# Patient Record
Sex: Female | Born: 1995 | Race: Black or African American | Hispanic: No | Marital: Single | Smoking: Never smoker
Health system: Southern US, Community
[De-identification: ages and names within clinical notes are randomized; demographics above are authoritative.]

## PROBLEM LIST (undated history)

## (undated) DIAGNOSIS — D649 Anemia, unspecified: Secondary | ICD-10-CM

---

## 2019-01-23 ENCOUNTER — Emergency Department (HOSPITAL_COMMUNITY)
Admission: EM | Admit: 2019-01-23 | Discharge: 2019-01-23 | Disposition: A | Discharge status: Home / Self Care | Payer: Self-pay | Attending: Emergency Medicine | Admitting: Emergency Medicine

## 2019-01-23 ENCOUNTER — Other Ambulatory Visit: Payer: Self-pay

## 2019-01-23 ENCOUNTER — Encounter (HOSPITAL_COMMUNITY): Payer: Self-pay | Admitting: Emergency Medicine

## 2019-01-23 DIAGNOSIS — H6690 Otitis media, unspecified, unspecified ear: Secondary | ICD-10-CM

## 2019-01-23 DIAGNOSIS — H6692 Otitis media, unspecified, left ear: Secondary | ICD-10-CM | POA: Insufficient documentation

## 2019-01-23 MED ORDER — IBUPROFEN 600 MG PO TABS: 600.0000 mg | ORAL_TABLET | Freq: Four times a day (QID) | ORAL | 0 refills | Status: DC | PRN

## 2019-01-23 MED ORDER — AMOXICILLIN 500 MG PO CAPS: 500.0000 mg | ORAL_CAPSULE | Freq: Three times a day (TID) | ORAL | 0 refills | Status: DC

## 2019-01-23 NOTE — ED Provider Notes (Signed)
[facility] EMERGENCY DEPARTMENT Provider Note   CSN: [id number] Arrival date & time: 01/23/19  0536    History   Chief Complaint Chief Complaint  Patient presents with  . Otalgia    HPI Angel Arnold is a 23 y.o. female.     The history is provided by the patient. No language interpreter was used.  Otalgia  Associated symptoms: no fever and no headaches      23 year old female presenting for evaluation of ear pain.  Patient reports she has had progressive worsening pain to her left ear since last night.  Pain is described as a throbbing sharp sensation, nonradiating, persistent and moderate in severity.  She feels that her ear is "clogged up".  Applying pressure to the ear with her hands does help with the pain.  She tries taking Tylenol without adequate relief.  She denies any associated fever, headache, ringing in her ear, dental pain, loss of hearing, drainage, pain when laying on the affected side, neck pain.  She does complains of some sinus congestion which is steadily improving but no sneezing or coughing.  History reviewed. No pertinent past medical history.  There are no active problems to display for this patient.   History reviewed. No pertinent surgical history.   OB History   No obstetric history on file.      Home Medications    Prior to Admission medications   Not on File    Family History No family history on file.  Social History Social History   Tobacco Use  . Smoking status: Never Smoker  . Smokeless tobacco: Never Used  Substance Use Topics  . Alcohol use: Yes  . Drug use: Never     Allergies   Patient has no known allergies.   Review of Systems Review of Systems  Constitutional: Negative for fever.  HENT: Positive for ear pain. Negative for dental problem.   Neurological: Negative for headaches.     Physical Exam Updated Vital Signs BP (!) 115/97 (BP Location: Left Arm)   Pulse 68   Temp 98.1 F (36.7  C) (Oral)   Resp 18   Ht 5\' 3"  (1.6 m)   Wt 65.8 kg   LMP 01/01/2019 (Exact Date)   SpO2 100%   BMI 25.69 kg/m   Physical Exam Vitals signs and nursing note reviewed.  Constitutional:      General: She is not in acute distress.    Appearance: She is well-developed.  HENT:     Head: Atraumatic.     Comments: Ears: Left TM is erythematous but not bulging.  No tenderness to palpation of the tragus or the earlobe.  TM is nonperforated and the normal ear canal.  Right TM is normal in appearance Nose: Normal nares Throat: Uvula midline no tonsillar enlargement or exudates, no trismus, normal dentition. Eyes:     Conjunctiva/sclera: Conjunctivae normal.  Neck:     Musculoskeletal: Neck supple.  Lymphadenopathy:     Cervical: No cervical adenopathy.  Skin:    Findings: No rash.  Neurological:     Mental Status: She is alert.      ED Treatments / Results  Labs (all labs ordered are listed, but only abnormal results are displayed) Labs Reviewed - No data to display  EKG None  Radiology No results found.  Procedures Procedures (including critical care time)  Medications Ordered in ED Medications - No data to display   Initial Impression / Assessment and Plan / ED Course  I have reviewed the triage vital signs and the nursing notes.  Pertinent labs & imaging results that were available during my care of the patient were reviewed by me and considered in my medical decision making (see chart for details).        BP (!) 115/97 (BP Location: Left Arm)   Pulse 68   Temp 98.1 F (36.7 C) (Oral)   Resp 18   Ht 5\' 3"  (1.6 m)   Wt 65.8 kg   LMP 01/01/2019 (Exact Date)   SpO2 100%   BMI 25.69 kg/m    Final Clinical Impressions(s) / ED Diagnoses   Final diagnoses:  Acute otitis media, unspecified otitis media type    ED Discharge Orders         Ordered    amoxicillin (AMOXIL) 500 MG capsule  3 times daily     01/23/19 0726    ibuprofen (ADVIL,MOTRIN) 600  MG tablet  Every 6 hours PRN     01/23/19 0726         6:34 AM Patient here with left ear pain.  Left TM is erythematous suggestive of otitis media.  No evidence of otitis externa.  Will treat with amoxicillin and ibuprofen.  Return precautions discussed.   Fayrene Helper, PA-C 01/23/19 6122    Nicanor Alcon, April, MD 01/23/19 [phone]

## 2019-01-23 NOTE — ED Triage Notes (Signed)
Pt reports an ear ache in her left ear. Pt reports this started about midnight. Pt denies any injury to same.

## 2019-02-02 ENCOUNTER — Telehealth: Payer: Self-pay | Admitting: Family

## 2019-02-02 DIAGNOSIS — Z7189 Other specified counseling: Secondary | ICD-10-CM

## 2019-02-02 NOTE — Progress Notes (Signed)
E-Visit for Corona Virus Screening  Based on your current symptoms, it seems unlikely that your symptoms are related to the Coronavirus.   Approximately 5 minutes was spent documenting and reviewing patient's chart.   Coronavirus disease 2019 (COVID-19) is a respiratory illness that can spread from person to person. The virus that causes COVID-19 is a new virus that was first identified in the country of [place] but is now found in multiple other countries and has spread to the [place].  Symptoms associated with the virus are mild to severe fever, cough, and shortness of breath. There is currently no vaccine to protect against COVID-19, and there is no specific antiviral treatment for the virus.   To be considered HIGH RISK for Coronavirus (COVID-19), you have to meet the following criteria:  . Traveled to [place], [place], [place], [place] or [place]; or in the [place] to [place], [place], [place], or [place]; and have fever, cough, and shortness of breath within the last 2 weeks of travel OR  . Been in close contact with a person diagnosed with COVID-19 within the last 2 weeks and have fever, cough, and shortness of breath  . IF YOU DO NOT MEET THESE CRITERIA, YOU ARE CONSIDERED LOW RISK FOR COVID-19.   It is vitally important that if you feel that you have an infection such as this virus or any other virus that you stay home and away from places where you may spread it to others.  You should self-quarantine for 14 days if you have symptoms that could potentially be coronavirus and avoid contact with people age 65 and older.     You may also take acetaminophen (Tylenol) as needed for fever.   Reduce your risk of any infection by using the same precautions used for avoiding the common cold or flu:  . Wash your hands often with soap and warm water for at least 20 seconds.  If soap and water are not readily available, use an alcohol-based hand sanitizer with at least 60% alcohol.   . If coughing or sneezing, cover your mouth and nose by coughing or sneezing into the elbow areas of your shirt or coat, into a tissue or into your sleeve (not your hands). . Avoid shaking hands with others and consider head nods or verbal greetings only. . Avoid touching your eyes, nose, or mouth with unwashed hands.  . Avoid close contact with people who are sick. . Avoid places or events with large numbers of people in one location, like concerts or sporting events. . Carefully consider travel plans you have or are making. . If you are planning any travel outside or inside the [place], visit the CDC's Travelers' Health webpage for the latest health notices. . If you have some symptoms but not all symptoms, continue to monitor at home and seek medical attention if your symptoms worsen. . If you are having a medical emergency, call 911.  HOME CARE . Only take medications as instructed by your medical team. . Drink plenty of fluids and get plenty of rest. . A steam or ultrasonic humidifier can help if you have congestion.   GET HELP RIGHT AWAY IF: . You develop worsening fever. . You become short of breath . You cough up blood. . Your symptoms become more severe MAKE SURE YOU   Understand these instructions.  Will watch your condition.  Will get help right away if you are not doing well or get worse.  Your e-visit   e-visit answers were reviewed by a board certified advanced clinical practitioner to complete your personal care plan.  Depending on the condition, your plan could have included both over the counter or prescription medications.  If there is a problem please reply once you have received a response from your provider. Your safety is important to [place].  If you have drug allergies check your prescription carefully.    You can use MyChart to ask questions about today's visit, request a non-urgent call back, or ask for a work or school excuse for 24 hours related to this e-Visit. If it has  been greater than 24 hours you will need to follow up with your provider, or enter a new e-Visit to address those concerns. You will get an e-mail in the next two days asking about your experience.  I hope that your e-visit has been valuable and will speed your recovery. Thank you for using e-visits.

## 2019-08-16 ENCOUNTER — Encounter (HOSPITAL_COMMUNITY): Payer: Self-pay

## 2019-08-16 ENCOUNTER — Inpatient Hospital Stay (HOSPITAL_COMMUNITY)
Admission: EM | Admit: 2019-08-16 | Discharge: 2019-08-16 | Disposition: A | Discharge status: Home / Self Care | Payer: Medicaid Other | Attending: Obstetrics and Gynecology | Admitting: Obstetrics and Gynecology

## 2019-08-16 ENCOUNTER — Other Ambulatory Visit: Payer: Self-pay

## 2019-08-16 ENCOUNTER — Inpatient Hospital Stay (HOSPITAL_COMMUNITY): Payer: Medicaid Other

## 2019-08-16 DIAGNOSIS — O26891 Other specified pregnancy related conditions, first trimester: Secondary | ICD-10-CM

## 2019-08-16 DIAGNOSIS — Z3491 Encounter for supervision of normal pregnancy, unspecified, first trimester: Secondary | ICD-10-CM

## 2019-08-16 DIAGNOSIS — Z3A01 Less than 8 weeks gestation of pregnancy: Secondary | ICD-10-CM | POA: Diagnosis not present

## 2019-08-16 DIAGNOSIS — R109 Unspecified abdominal pain: Secondary | ICD-10-CM

## 2019-08-16 DIAGNOSIS — R1030 Lower abdominal pain, unspecified: Secondary | ICD-10-CM | POA: Diagnosis present

## 2019-08-16 DIAGNOSIS — O23591 Infection of other part of genital tract in pregnancy, first trimester: Secondary | ICD-10-CM | POA: Insufficient documentation

## 2019-08-16 DIAGNOSIS — O210 Mild hyperemesis gravidarum: Secondary | ICD-10-CM | POA: Insufficient documentation

## 2019-08-16 DIAGNOSIS — Z3A09 9 weeks gestation of pregnancy: Secondary | ICD-10-CM

## 2019-08-16 DIAGNOSIS — B9689 Other specified bacterial agents as the cause of diseases classified elsewhere: Secondary | ICD-10-CM

## 2019-08-16 DIAGNOSIS — O219 Vomiting of pregnancy, unspecified: Secondary | ICD-10-CM

## 2019-08-16 DIAGNOSIS — N760 Acute vaginitis: Secondary | ICD-10-CM

## 2019-08-16 HISTORY — DX: Anemia, unspecified: D64.9

## 2019-08-16 LAB — CBC
HCT: 30.5 % — ABNORMAL LOW (ref 36.0–46.0)
Hemoglobin: 10.1 g/dL — ABNORMAL LOW (ref 12.0–15.0)
MCH: 29.4 pg (ref 26.0–34.0)
MCHC: 33.1 g/dL (ref 30.0–36.0)
MCV: 88.9 fL (ref 80.0–100.0)
Platelets: 286 10*3/uL (ref 150–400)
RBC: 3.43 MIL/uL — ABNORMAL LOW (ref 3.87–5.11)
RDW: 16 % — ABNORMAL HIGH (ref 11.5–15.5)
WBC: 8.9 10*3/uL (ref 4.0–10.5)
nRBC: 0 % (ref 0.0–0.2)

## 2019-08-16 LAB — URINALYSIS, ROUTINE W REFLEX MICROSCOPIC
Bacteria, UA: NONE SEEN
Bilirubin Urine: NEGATIVE
Glucose, UA: NEGATIVE mg/dL
Hgb urine dipstick: NEGATIVE
Ketones, ur: NEGATIVE mg/dL
Leukocytes,Ua: NEGATIVE
Nitrite: NEGATIVE
Protein, ur: 30 mg/dL — AB
Specific Gravity, Urine: 1.027 (ref 1.005–1.030)
pH: 7 (ref 5.0–8.0)

## 2019-08-16 LAB — ABO/RH: ABO/RH(D): A POS

## 2019-08-16 LAB — WET PREP, GENITAL
Sperm: NONE SEEN
Trich, Wet Prep: NONE SEEN
Yeast Wet Prep HPF POC: NONE SEEN

## 2019-08-16 LAB — HCG, QUANTITATIVE, PREGNANCY: hCG, Beta Chain, Quant, S: 94331 m[IU]/mL — ABNORMAL HIGH (ref <5)

## 2019-08-16 LAB — HIV ANTIBODY (ROUTINE TESTING W REFLEX): HIV Screen 4th Generation wRfx: NONREACTIVE

## 2019-08-16 LAB — POCT PREGNANCY, URINE: Preg Test, Ur: POSITIVE — AB

## 2019-08-16 MED ORDER — SCOPOLAMINE 1 MG/3DAYS TD PT72
1.0000 | MEDICATED_PATCH | TRANSDERMAL | Status: DC
  Administered 2019-08-16: 1.5 mg via TRANSDERMAL
  Filled 2019-08-16: qty 1

## 2019-08-16 MED ORDER — METRONIDAZOLE 0.75 % VA GEL: 1.0000 | Freq: Every day | VAGINAL | 1 refills | Status: DC

## 2019-08-16 MED ORDER — SCOPOLAMINE 1 MG/3DAYS TD PT72: 1.0000 | MEDICATED_PATCH | TRANSDERMAL | 12 refills | Status: DC

## 2019-08-16 NOTE — ED Triage Notes (Addendum)
Pt reports she is [redacted] weeks pregnant and experiencing vaginal bleeding and cramping.  Danelle RN approved pt to be transported to MAU

## 2019-08-16 NOTE — MAU Provider Note (Signed)
Chief Complaint: Vaginal Bleeding, Abdominal Pain, Morning Sickness, and Insomnia   First Provider Initiated Contact with Patient 08/16/19 0429      SUBJECTIVE HPI: Angel Arnold is a 23 y.o. G2P1001 at Unknown by unsure LMP who presents to maternity admissions reporting positive pregnancy test 4 weeks ago with onset of lower abdominal cramping 4 weeks ago as well. The pain is cramping and sharp in the middle of her low abdomen. It is intermittent, occurring at least one time per day, and sometimes as often as every hour.  She reports bleeding after intercourse also, sometimes enough to soak the bed with blood. She denies any bleeding today.  She has nausea with vomiting 3-4 times per day and cannot keep down food or fluids.  There are no other symptoms. She has not tried any treatments.   HPI  Past Medical History:  Diagnosis Date  . Anemia    Past Surgical History:  Procedure Laterality Date  . CESAREAN SECTION     Social History   Socioeconomic History  . Marital status: Single    Spouse name: Not on file  . Number of children: Not on file  . Years of education: Not on file  . Highest education level: Not on file  Occupational History  . Not on file  Social Needs  . Financial resource strain: Not on file  . Food insecurity    Worry: Not on file    Inability: Not on file  . Transportation needs    Medical: Not on file    Non-medical: Not on file  Tobacco Use  . Smoking status: Never Smoker  . Smokeless tobacco: Never Used  Substance and Sexual Activity  . Alcohol use: Yes  . Drug use: Never  . Sexual activity: Not Currently  Lifestyle  . Physical activity    Days per week: Not on file    Minutes per session: Not on file  . Stress: Not on file  Relationships  . Social Musicianconnections    Talks on phone: Not on file    Gets together: Not on file    Attends religious service: Not on file    Active member of club or organization: Not on file    Attends meetings of clubs  or organizations: Not on file    Relationship status: Not on file  . Intimate partner violence    Fear of current or ex partner: Not on file    Emotionally abused: Not on file    Physically abused: Not on file    Forced sexual activity: Not on file  Other Topics Concern  . Not on file  Social History Narrative  . Not on file   No current facility-administered medications on file prior to encounter.    Current Outpatient Medications on File Prior to Encounter  Medication Sig Dispense Refill  . amoxicillin (AMOXIL) 500 MG capsule Take 1 capsule (500 mg total) by mouth 3 (three) times daily. 21 capsule 0  . ibuprofen (ADVIL,MOTRIN) 600 MG tablet Take 1 tablet (600 mg total) by mouth every 6 (six) hours as needed. 30 tablet 0   No Known Allergies  ROS:  Review of Systems  Constitutional: Negative for chills, fatigue and fever.  Respiratory: Negative for shortness of breath.   Cardiovascular: Negative for chest pain.  Gastrointestinal: Positive for abdominal pain, nausea and vomiting.  Genitourinary: Positive for pelvic pain and vaginal bleeding. Negative for difficulty urinating, dysuria, flank pain, vaginal discharge and vaginal pain.  Neurological: Negative  for dizziness and headaches.  Psychiatric/Behavioral: Negative.      I have reviewed patient's Past Medical Hx, Surgical Hx, Family Hx, Social Hx, medications and allergies.   Physical Exam   Patient Vitals for the past 24 hrs:  BP Temp Temp src Pulse Resp SpO2 Height Weight  08/16/19 0248 115/68 - Oral (!) 102 16 100 %  (1.575 m) 62.6 kg  08/16/19 0156 119/68 98.9 F (37.2 C) Oral 98 16 100 % - -   Constitutional: Well-developed, well-nourished female in no acute distress.  Cardiovascular: normal rate Respiratory: normal effort GI: Abd soft, non-tender. Pos BS x 4 MS: Extremities nontender, no edema, normal ROM Neurologic: Alert and oriented x 4.  GU: Neg CVAT.  PELVIC EXAM: Cervix pink, visually closed,  without lesion, moderate amount white creamy discharge, no bleeding visualized, vaginal walls and external genitalia normal Bimanual exam: Cervix 0/long/high, firm, anterior, neg CMT, uterus nontender, nonenlarged, adnexa with tenderness on right to palpation   LAB RESULTS Results for orders placed or performed during the hospital encounter of 08/16/19 (from the past 24 hour(s))  Urinalysis, Routine w reflex microscopic     Status: Abnormal   Collection Time: 08/16/19  2:58 AM  Result Value Ref Range   Color, Urine YELLOW YELLOW   APPearance CLEAR CLEAR   Specific Gravity, Urine 1.027 1.005 - 1.030   pH 7.0 5.0 - 8.0   Glucose, UA NEGATIVE NEGATIVE mg/dL   Hgb urine dipstick NEGATIVE NEGATIVE   Bilirubin Urine NEGATIVE NEGATIVE   Ketones, ur NEGATIVE NEGATIVE mg/dL   Protein, ur 30 (A) NEGATIVE mg/dL   Nitrite NEGATIVE NEGATIVE   Leukocytes,Ua NEGATIVE NEGATIVE   RBC / HPF 0-5 0 - 5 RBC/hpf   WBC, UA 0-5 0 - 5 WBC/hpf   Bacteria, UA NONE SEEN NONE SEEN   Squamous Epithelial / LPF 0-5 0 - 5   Mucus PRESENT   Pregnancy, urine POC     Status: Abnormal   Collection Time: 08/16/19  2:59 AM  Result Value Ref Range   Preg Test, Ur POSITIVE (A) NEGATIVE  Wet prep, genital     Status: Abnormal   Collection Time: 08/16/19  4:38 AM  Result Value Ref Range   Yeast Wet Prep HPF POC NONE SEEN NONE SEEN   Trich, Wet Prep NONE SEEN NONE SEEN   Clue Cells Wet Prep HPF POC PRESENT (A) NONE SEEN   WBC, Wet Prep HPF POC MANY (A) NONE SEEN   Sperm NONE SEEN   CBC     Status: Abnormal   Collection Time: 08/16/19  4:51 AM  Result Value Ref Range   WBC 8.9 4.0 - 10.5 K/uL   RBC 3.43 (L) 3.87 - 5.11 MIL/uL   Hemoglobin 10.1 (L) 12.0 - 15.0 g/dL   HCT 16.1 (L) 09.6 - 04.5 %   MCV 88.9 80.0 - 100.0 fL   MCH 29.4 26.0 - 34.0 pg   MCHC 33.1 30.0 - 36.0 g/dL   RDW 40.9 (H) 81.1 - 91.4 %   Platelets 286 150 - 400 K/uL   nRBC 0.0 0.0 - 0.2 %  hCG, quantitative, pregnancy     Status: Abnormal    Collection Time: 08/16/19  4:51 AM  Result Value Ref Range   hCG, Beta Chain, Quant, S 94,331 (H) <5 mIU/mL  ABO/Rh     Status: None   Collection Time: 08/16/19  4:51 AM  Result Value Ref Range   ABO/RH(D) A POS    No  rh immune globuloin      NOT A RH IMMUNE GLOBULIN CANDIDATE, PT RH POSITIVE Performed at [facility], [place] [address] [place], [place] [address]     --/--/A POS (10/04 0451)  IMAGING US Ob Comp Less 14 Wks  Result Date: 08/16/2019 CLINICAL DATA:  Pelvic pain EXAM: OBSTETRIC <14 WK ULTRASOUND TECHNIQUE: Transabdominal ultrasound was performed for evaluation of the gestation as well as the maternal uterus and adnexal regions. COMPARISON:  None. FINDINGS: Intrauterine gestational sac: Single Yolk sac:  Visualized. Embryo:  Visualized. Cardiac Activity: Visualized. Heart Rate: 173 bpm CRL:   17.1 mm   8 w 1 d                  [place] EDC: 03/26/2020 Subchorionic hemorrhage: Small subchorionic hemorrhage measuring 1.5 x 0.6 x 1.1 cm. Maternal uterus/adnexae: No adnexal mass.  No pelvic free fluid. IMPRESSION: 1. Single live intrauterine pregnancy as detailed above. 2. Small subchorionic hemorrhage. Electronically Signed   By: Kathreen Devoid   On: 08/16/2019 05:46    MAU Management/MDM: Orders Placed This Encounter  Procedures  . Wet prep, genital  . US OB Comp Less 14 Wks  . Urinalysis, Routine w reflex microscopic  . CBC  . hCG, quantitative, pregnancy  . HIV Antibody (routine testing w rflx)  . Pregnancy, urine POC  . ABO/Rh  . Discharge patient    Meds ordered this encounter  Medications  . scopolamine (TRANSDERM-SCOP) 1 MG/3DAYS 1.5 mg  . scopolamine (TRANSDERM-SCOP) 1 MG/3DAYS    Sig: Place 1 patch (1.5 mg total) onto the skin every 3 (three) days.    Dispense:  10 patch    Refill:  12    Order Specific Question:   Supervising Provider    Answer:   CONSTANT, PEGGY [[id number]]  . metroNIDAZOLE (METROGEL) 0.75 % vaginal gel    Sig: Place 1 Applicatorful vaginally at  bedtime. Apply one applicatorful to vagina at bedtime for 5 days    Dispense:  70 g    Refill:  1    Order Specific Question:   Supervising Provider    Answer:   CONSTANT, PEGGY [[id number]]    [place] confirms IUP with small subchorionic hemorrhage.  Will treat BV due to pain and clinical exam.  Scopolamine patch applied in MAU and Rx written. Pt to f/u at [facility] as scheduled, return to MAU as needed for emergencies.  Pt discharged with strict return precautions.  ASSESSMENT 1. Abdominal pain during pregnancy, first trimester   2. Abdominal pain during pregnancy in first trimester   3. Normal IUP (intrauterine pregnancy) on prenatal ultrasound, first trimester   4. Bacterial vaginosis   5. Nausea and vomiting during pregnancy prior to [redacted] weeks gestation     PLAN Discharge home Allergies as of 08/16/2019   No Known Allergies     Medication List    STOP taking these medications   amoxicillin 500 MG capsule Commonly known as: AMOXIL   ibuprofen 600 MG tablet Commonly known as: ADVIL     TAKE these medications   metroNIDAZOLE 0.75 % vaginal gel Commonly known as: METROGEL Place 1 Applicatorful vaginally at bedtime. Apply one applicatorful to vagina at bedtime for 5 days   scopolamine 1 MG/3DAYS Commonly known as: TRANSDERM-SCOP Place 1 patch (1.5 mg total) onto the skin every 3 (three) days.      Follow-up Information    [facility] Follow up.   Why: As scheduled. Return to MAU as  needed for emergencies. Contact information: [address] [place] [address] [phone]          Sharen Counter Certified Nurse-Midwife 08/16/2019  6:24 AM

## 2019-08-16 NOTE — MAU Note (Signed)
Pt reports she is 7.[redacted] weeks pregnant and here with c/o of sharp abdominal pain. Has a lot of vaginal bleeding after sex. Has not had a decent night of sleep "in months." Throws up everything she eats. Confirmed at [facility]. Has OBGYN appointment on 10/26 with [facility].

## 2019-08-18 LAB — GC/CHLAMYDIA PROBE AMP (~~LOC~~) NOT AT ARMC
Chlamydia: NEGATIVE
Neisseria Gonorrhea: NEGATIVE

## 2019-09-07 ENCOUNTER — Ambulatory Visit (INDEPENDENT_AMBULATORY_CARE_PROVIDER_SITE_OTHER): Payer: Medicaid Other | Admitting: Advanced Practice Midwife

## 2019-09-07 ENCOUNTER — Encounter: Payer: Self-pay | Admitting: Advanced Practice Midwife

## 2019-09-07 ENCOUNTER — Other Ambulatory Visit: Payer: Self-pay

## 2019-09-07 VITALS — BP 107/71 | HR 103 | Wt 129.0 lb

## 2019-09-07 DIAGNOSIS — Z8659 Personal history of other mental and behavioral disorders: Secondary | ICD-10-CM

## 2019-09-07 DIAGNOSIS — Z3A11 11 weeks gestation of pregnancy: Secondary | ICD-10-CM

## 2019-09-07 DIAGNOSIS — F329 Major depressive disorder, single episode, unspecified: Secondary | ICD-10-CM

## 2019-09-07 DIAGNOSIS — O219 Vomiting of pregnancy, unspecified: Secondary | ICD-10-CM

## 2019-09-07 DIAGNOSIS — O99341 Other mental disorders complicating pregnancy, first trimester: Secondary | ICD-10-CM

## 2019-09-07 DIAGNOSIS — Z3401 Encounter for supervision of normal first pregnancy, first trimester: Secondary | ICD-10-CM | POA: Diagnosis not present

## 2019-09-07 DIAGNOSIS — Z3480 Encounter for supervision of other normal pregnancy, unspecified trimester: Secondary | ICD-10-CM

## 2019-09-07 MED ORDER — VITAFOL GUMMIES 3.33-0.333-34.8 MG PO CHEW: 1.0000 | CHEWABLE_TABLET | Freq: Every day | ORAL | 5 refills | Status: AC

## 2019-09-07 MED ORDER — BLOOD PRESSURE KIT DEVI: 1.0000 | 0 refills | Status: DC

## 2019-09-07 MED ORDER — DOXYLAMINE-PYRIDOXINE 10-10 MG PO TBEC: DELAYED_RELEASE_TABLET | ORAL | 5 refills | Status: DC

## 2019-09-07 MED ORDER — ONDANSETRON 4 MG PO TBDP: 4.0000 mg | ORAL_TABLET | Freq: Four times a day (QID) | ORAL | 5 refills | Status: DC | PRN

## 2019-09-07 NOTE — Progress Notes (Signed)
Subjective:   Angel Arnold is a 23 y.o. G2P1001 at 31w2dby LMP being seen today for her first obstetrical visit.  Her obstetrical history is significant for no risk factors and has Supervision of other normal pregnancy, antepartum on their problem list.. Patient does intend to breast feed. Pregnancy history fully reviewed.  Patient reports backache, no bleeding, no leaking, vomiting and daily lower abdominal pain (mostly right-sided). She reports that this pregnancy was unplanned but wanted. She states that she occasionally has thoughts that "she cannot continue (the pregnancy/moving forward)" because her n/v and pain is so severe. She expressed significant guilt about these thoughts and confirmed that the pregnancy is wanted and she has good support from her fiance and family. She states that she has not shared these feelings with anyone else before. She also reports a Hx of bipolar disorder Dx at age 89 She took medications, unknown name, for ~3 months at that time but stopped due to how they made her feel (sleepy, not like self). She states that she has struggled with depression on and off throughout life and has seen a therapist recently. She stopped seeing them prior to this pregnancy because she felt like she had been improving. This therapist has previously recommended she be prescribed medications by a provider. She reports cultural barriers to medication usage for mental health concerns and cultural stigma associated with seeing a mental health provider. She denies active SI/HI but did endorse feelings that "she would be better off dead" or "the world would be better without her."  HISTORY: OB History  Gravida Para Term Preterm AB Living  _0 0 0 1  SAB TAB Ectopic Multiple Live Births  0 0 0 0 1    # Outcome Date GA Lbr Len/2nd Weight Sex Delivery Anes PTL Lv  2 Current           1 Term      CS-LTranv      Past Medical History:  Diagnosis Date  . Anemia    Past Surgical  History:  Procedure Laterality Date  . CESAREAN SECTION     History reviewed. No pertinent family history. Social History   Tobacco Use  . Smoking status: Never Smoker  . Smokeless tobacco: Never Used  Substance Use Topics  . Alcohol use: Not Currently  . Drug use: Never   No Known Allergies Current Outpatient Medications on File Prior to Visit  Medication Sig Dispense Refill  . scopolamine (TRANSDERM-SCOP) 1 MG/3DAYS Place 1 patch (1.5 mg total) onto the skin every 3 (three) days. 10 patch 12  . metroNIDAZOLE (METROGEL) 0.75 % vaginal gel Place 1 Applicatorful vaginally at bedtime. Apply one applicatorful to vagina at bedtime for 5 days (Patient not taking: Reported on 09/07/2019) 70 g 1   No current facility-administered medications on file prior to visit.     No Indications for ASA therapy (per uptodate) One of the following: Previous pregnancy with preeclampsia, especially early onset and with an adverse outcome No Multifetal gestation No Chronic hypertension No Type 1 or 2 diabetes mellitus No Chronic kidney disease No Autoimmune disease (antiphospholipid syndrome, systemic lupus erythematosus) No  No Two or more of the following: Nulliparity No Obesity (body mass index >30 kg/m2) No Family history of preeclampsia in mother or sister No Age ?35 years No Sociodemographic characteristics (African American race, low socioeconomic level) Yes Personal risk factors (eg, previous pregnancy with low birth weight or small for gestational age infant, previous  adverse pregnancy outcome [eg, stillbirth], interval >10 years between pregnancies) No  No Indications for early 1 hour GTT (per uptodate)  BMI >25 (>23 in Asian women) AND one of the following  Gestational diabetes mellitus in a previous pregnancy No Glycated hemoglobin ?5.7 percent (39 mmol/mol), impaired glucose tolerance, or impaired fasting glucose on previous testing No First-degree relative with diabetes No  High-risk race/ethnicity (eg, African American, Latino, Native American, [place] American, Pacific Islander) Yes History of cardiovascular disease No Hypertension or on therapy for hypertension No High-density lipoprotein cholesterol level <35 mg/dL (0.90 mmol/L) and/or a triglyceride level >250 mg/dL (2.82 mmol/L) No Polycystic ovary syndrome No Physical inactivity No Other clinical condition associated with insulin resistance (eg, severe obesity, acanthosis nigricans) No Previous birth of an infant weighing ?4000 g No Previous stillbirth of unknown cause No Exam   Vitals:   09/07/19 1526  BP: 107/71  Pulse: (!) 103  Weight: 129 lb (58.5 kg)   Fetal Heart Rate (bpm): 170  VS reviewed, nursing note reviewed,  Constitutional: well developed, well nourished, no distress HEENT: normocephalic CV: normal rate Pulm/chest wall: normal effort Abdomen: soft, mildly tender in RLQ, not distended, no rebound or guarding Neuro: alert and oriented x 3 Skin: warm, dry Psych: tearful, affect appropriate, depressed mood  Assessment:   Pregnancy: G2P1001 Patient Active Problem List   Diagnosis Date Noted  . Supervision of other normal pregnancy, antepartum 09/07/2019     Plan:  1. Supervision of other normal pregnancy, antepartum Klaire is progressing appropriately in her pregnancy despite significant N/V/pain. She recently saw the MAU of pelvic pain and had G/C swabs done at that time. We will begin a regimen of diclegis and zofran as noted below for her symptoms. Recommend routine follow-up in 4 weeks, in person given her concerns. - Obstetric Panel, Including HIV - Culture, OB Urine - Genetic Screening - Prenatal Vit-Fe Phos-FA-Omega (VITAFOL GUMMIES) 3.33-0.333-34.8 MG CHEW; Chew 1 tablet by mouth daily.  Dispense: 90 tablet; Refill: 5 - Blood Pressure Monitoring (BLOOD PRESSURE KIT) DEVI; 1 kit by Does not apply route once a week. Check BP regularly.  Large Cuff.  DX: O90.0  Dispense: 1  kit; Refill: 0  2. Depression during pregnancy in first trimester Lorece's mood s/s are concerning for depression affecting pregnancy. We will connect her with our behavioral health provider for further evaluation and recommendations moving forward. Given her passive thoughts noted in the HPI, we encourage close follow-up and having an appointment soon with behavioral health. - Ambulatory referral to [facility]  3. History of bipolar disorder Unclear of subtype per history, recommend clarification with behavioral health referral and medications/other interventions per their recommendations. - Ambulatory referral to [facility]  4. Nausea and vomiting during pregnancy prior to [redacted] weeks gestation History as above, Randilyn is significantly affected by these s/s. We will begin regimen of diclegis and zofran. Encouraged her to go to MAU if unable to keep fluids/food down for >24 hours and calling us if her symptoms persist even on her regimen.  - Doxylamine-Pyridoxine (DICLEGIS) 10-10 MG TBEC; Take 2 tabs at bedtime. If needed, add another tab in the morning. If needed, add another tab in the afternoon, up to 4 tabs/day.  Dispense: 100 tablet; Refill: 5    Initial labs drawn. Continue prenatal vitamins. Discussed and offered genetic screening options, including Quad screen/AFP, NIPS testing, and option to decline testing. Benefits/risks/alternatives reviewed. Pt aware that anatomy US is form of genetic screening with lower accuracy  in detecting trisomies than blood work.  Pt chooses/declines genetic screening today. Not discussed: results reviewed. Ultrasound discussed; fetal anatomic survey: undecided. Problem list reviewed and updated. The nature of [facility] with multiple MDs and other Advanced Practice Providers was explained to patient; also emphasized that residents, students are part of our team. Routine obstetric precautions  reviewed. Return in about 4 weeks (around 10/05/2019).   Kathryne Eriksson, Medical Student 09/07/19 5:00 PM

## 2019-09-07 NOTE — Patient Instructions (Addendum)
afe Medications in Pregnancy   Acne: Benzoyl Peroxide Salicylic Acid  Backache/Headache: Tylenol: 2 regular strength every 4 hours OR              2 Extra strength every 6 hours  Colds/Coughs/Allergies: Benadryl (alcohol free) 25 mg every 6 hours as needed Breath right strips Claritin Cepacol throat lozenges Chloraseptic throat spray Cold-Eeze- up to three times per day Cough drops, alcohol free Flonase (by prescription only) Guaifenesin Mucinex Robitussin DM (plain only, alcohol free) Saline nasal spray/drops Sudafed (pseudoephedrine) & Actifed ** use only after [redacted] weeks gestation and if you do not have high blood pressure Tylenol Vicks Vaporub Zinc lozenges Zyrtec   Constipation: Colace Ducolax suppositories Fleet enema Glycerin suppositories Metamucil Milk of magnesia Miralax Senokot Smooth move tea  Diarrhea: Kaopectate Imodium A-D  *NO pepto Bismol  Hemorrhoids: Anusol Anusol HC Preparation H Tucks  Indigestion: Tums Maalox Mylanta Zantac  Pepcid  Insomnia: Benadryl (alcohol free) 25mg  every 6 hours as needed Tylenol PM Unisom, no Gelcaps  Leg Cramps: Tums MagGel  Nausea/Vomiting:  Bonine Dramamine Emetrol Ginger extract Sea bands Meclizine  Nausea medication to take during pregnancy:  Unisom (doxylamine succinate 25 mg tablets) Take one tablet daily at bedtime. If symptoms are not adequately controlled, the dose can be increased to a maximum recommended dose of two tablets daily (1/2 tablet in the morning, 1/2 tablet mid-afternoon and one at bedtime). Vitamin B6 100mg  tablets. Take one tablet twice a day (up to 200 mg per day).  Skin Rashes: Aveeno products Benadryl cream or 25mg  every 6 hours as needed Calamine Lotion 1% cortisone cream  Yeast infection: Gyne-lotrimin 7 Monistat 7   **If taking multiple medications, please check labels to avoid duplicating the same active ingredients **take medication as directed on the  label ** Do not exceed 4000 mg of tylenol in 24 hours **Do not take medications that contain aspirin or ibuprofen    Psychiatric Services [facility] of Care  [address] [place], [place]  [place] [address] [place], [place] [phone] or [phone] [email or web address]  [facility], 8:30-5:00 [address] [place], [place] [email or web address]  *Bring your own interpreter at 1st visit  [place] [address], [place], [place], [place] [email or web address]   Psychotherapeutic Services/ACT Services  [address] [place], [place]  [facility]in Mon-Fri, 8am-3pm [address] [place], [place] [email or web address]  [place], [place], [place]  [place] [address] [place], [place]  [facility] of the [place] [address] [place], [place] que hablen espanol, favor comunicarse con el Sr. [place], extension [phone] o la [address], extension [place] para hacer una cita.   [facility] [place] (Habla Espanol)  [facility] [place], [place], [place]  [place], [place], [place]   [place] [facility](Healing and Empowering All Survivors)  [address], [place], [place], [place] [email or web address]  *Uninsured and underinsured, ages 19-64  [facility] [place], [place], [place] ([place])  [facility] [address], [place],  [place] (Habla Espanol)  [facility] [address] [place], [place] Cinda Quest)  [place] 8:30am-8:00pm/ Fri 8:30-7:00pm [address], [place], [place] (3rd floor, located  at corner of [employer] and [employer]) Call [phone] to schedule an appointment [email or web address]  [facility] [address], [place], [place]  [phone]  [facility]  [address], [place], [place] [phone]  Social Support MHAG ([facility] [place]) [phone] or [email or web address] [address], [place], [place] [address] * Recovery support and educational programs, including recovery skills classes, support groups, and one-on-one sessions with [place] Certified Peer Support Specialists.    NAMI [facility] of the Mentally Ill) [place] NAMI Helpline: 513-167-8828 * Family and Friends Support Group/  Contact Philomena Doheny at [phone] for more information * Family to [place] [employer] and Basics Class : enroll online or email Darreld Mclean at [email or web address]  * Monthly educational meetings, contact Dwain Sarna at [phone] [email or web address]    24- Hour Availability:  [facility] ([phone] or [phone]  * Family Service of the [employer] (Domestic Violence, Rape, Victim Assistance) Line [phone]  [place] [facility]er (339)873-6518 or (225)512-6770  * [facility] [place] [facility]  (203) 530-9108 only(207) 689-3759 hours)  *Therapeutic Alternative Mobile Crisis Unit 712-639-3573  *[place] National Suicide Hotline 929-296-9080   Morning Sickness  Morning sickness is when a woman feels nauseous during pregnancy. This nauseous feeling may or may not come with vomiting. It often occurs in the morning, but it can be a problem at any time of day. Morning sickness is  most common during the first trimester. In some cases, it may continue throughout pregnancy. Although morning sickness is unpleasant, it is usually harmless unless the woman develops severe and continual vomiting (hyperemesis gravidarum), a condition that requires more intense treatment. What are the causes? The exact cause of this condition is not known, but it seems to be related to normal hormonal changes that occur in pregnancy. What increases the risk? You are more likely to develop this condition if:  You experienced nausea or vomiting before your pregnancy.  You had morning sickness during a previous pregnancy.  You are pregnant with more than one baby, such as twins. What are the signs or symptoms? Symptoms of this condition include:  Nausea.  Vomiting. How is this diagnosed? This condition is usually diagnosed based on your signs and symptoms. How is this treated? In many cases, treatment is not needed for this condition. Making some changes to what you eat may help to control symptoms. Your health care provider may also prescribe or recommend:  Vitamin B6 supplements.  Anti-nausea medicines.  Ginger. Follow these instructions at home: Medicines  Take over-the-counter and prescription medicines only as told by your health care provider. Do not use any prescription, over-the-counter, or herbal medicines for morning sickness without first talking with your health care provider.  Taking multivitamins before getting pregnant can prevent or decrease the severity of morning sickness in most women. Eating and drinking  Eat a piece of dry toast or crackers before getting out of bed in the morning.  Eat 5 or 6 small meals a day.  Eat dry and bland foods, such as rice or a baked potato. Foods that are high in carbohydrates are often helpful.  Avoid greasy, fatty, and spicy foods.  Have someone cook for you if the smell of any food causes nausea and vomiting.  If you feel  nauseous after taking prenatal vitamins, take the vitamins at night or with a snack.  Snack on protein foods between meals if you are hungry. Nuts, yogurt, and cheese are good options.  Drink  fluids throughout the day.  Try ginger ale made with real ginger, ginger tea made from fresh grated ginger, or ginger candies. General instructions  Do not use any products that contain nicotine or tobacco, such as cigarettes and e-cigarettes. If you need help quitting, ask your health care provider.  Get an air purifier to keep the air in your house free of odors.  Get plenty of fresh air.  Try to avoid odors that trigger your nausea.  Consider trying these methods to help relieve symptoms: ? Wearing an acupressure wristband. These wristbands are often worn for seasickness. ? Acupuncture. Contact a health care provider if:  Your home remedies are not working and you need medicine.  You feel dizzy or light-headed.  You are losing weight. Get help right away if:  You have persistent and uncontrolled nausea and vomiting.  You faint.  You have severe pain in your abdomen. Summary  Morning sickness is when a woman feels nauseous during pregnancy. This nauseous feeling may or may not come with vomiting.  Morning sickness is most common during the first trimester.  It often occurs in the morning, but it can be a problem at any time of day.  In many cases, treatment is not needed for this condition. Making some changes to what you eat may help to control symptoms. This information is not intended to replace advice given to you by your health care provider. Make sure you discuss any questions you have with your health care provider. Document Released: 12/20/2006 Document Revised: 10/11/2017 Document Reviewed: 12/01/2016 Elsevier Patient Education  2020 [employer]

## 2019-09-08 DIAGNOSIS — O99341 Other mental disorders complicating pregnancy, first trimester: Secondary | ICD-10-CM | POA: Insufficient documentation

## 2019-09-08 DIAGNOSIS — Z8659 Personal history of other mental and behavioral disorders: Secondary | ICD-10-CM | POA: Insufficient documentation

## 2019-09-08 DIAGNOSIS — F329 Major depressive disorder, single episode, unspecified: Secondary | ICD-10-CM | POA: Insufficient documentation

## 2019-09-08 DIAGNOSIS — O219 Vomiting of pregnancy, unspecified: Secondary | ICD-10-CM | POA: Insufficient documentation

## 2019-09-08 LAB — OBSTETRIC PANEL, INCLUDING HIV
Antibody Screen: NEGATIVE
Basophils Absolute: 0.1 10*3/uL (ref 0.0–0.2)
Basos: 1 %
EOS (ABSOLUTE): 0.1 10*3/uL (ref 0.0–0.4)
Eos: 1 %
HIV Screen 4th Generation wRfx: NONREACTIVE
Hematocrit: 33 % — ABNORMAL LOW (ref 34.0–46.6)
Hemoglobin: 10.7 g/dL — ABNORMAL LOW (ref 11.1–15.9)
Hepatitis B Surface Ag: NEGATIVE
Immature Grans (Abs): 0 10*3/uL (ref 0.0–0.1)
Immature Granulocytes: 0 %
Lymphocytes Absolute: 1.8 10*3/uL (ref 0.7–3.1)
Lymphs: 24 %
MCH: 29.3 pg (ref 26.6–33.0)
MCHC: 32.4 g/dL (ref 31.5–35.7)
MCV: 90 fL (ref 79–97)
Monocytes Absolute: 0.5 10*3/uL (ref 0.1–0.9)
Monocytes: 7 %
Neutrophils Absolute: 5 10*3/uL (ref 1.4–7.0)
Neutrophils: 67 %
Platelets: 286 10*3/uL (ref 150–450)
RBC: 3.65 x10E6/uL — ABNORMAL LOW (ref 3.77–5.28)
RDW: 15.2 % (ref 11.7–15.4)
RPR Ser Ql: NONREACTIVE
Rh Factor: POSITIVE
Rubella Antibodies, IGG: 20.5 index (ref >0.99)
WBC: 7.4 10*3/uL (ref 3.4–10.8)

## 2019-09-10 NOTE — BH Specialist Note (Signed)
Pt did not arrive to video visit and did not answer the phone; Left HIPPA-compliant message to call back Roselyn Reef from [facility] [employer] at [phone]. Sent MyChart message to patient.    [place] via Telemedicine Video Visit  09/10/2019 Angel Arnold [id number]  [name]

## 2019-09-11 ENCOUNTER — Ambulatory Visit: Payer: Medicaid Other | Admitting: Clinical

## 2019-09-11 ENCOUNTER — Encounter: Payer: Self-pay | Admitting: Obstetrics & Gynecology

## 2019-09-11 ENCOUNTER — Other Ambulatory Visit: Payer: Self-pay

## 2019-09-11 ENCOUNTER — Telehealth: Payer: Self-pay | Admitting: Clinical

## 2019-09-11 DIAGNOSIS — Z91199 Patient's noncompliance with other medical treatment and regimen due to unspecified reason: Secondary | ICD-10-CM

## 2019-09-11 DIAGNOSIS — Z5329 Procedure and treatment not carried out because of patient's decision for other reasons: Secondary | ICD-10-CM

## 2019-09-11 LAB — URINE CULTURE, OB REFLEX

## 2019-09-11 LAB — CULTURE, OB URINE

## 2019-09-11 NOTE — Telephone Encounter (Signed)
Returned pt's call; pt apologizes for no-show, and agrees for reschedule on 09/18/2019

## 2019-09-16 ENCOUNTER — Encounter: Payer: Self-pay | Admitting: Advanced Practice Midwife

## 2019-09-18 ENCOUNTER — Other Ambulatory Visit: Payer: Self-pay

## 2019-09-18 ENCOUNTER — Ambulatory Visit (INDEPENDENT_AMBULATORY_CARE_PROVIDER_SITE_OTHER): Payer: Medicaid Other | Admitting: Clinical

## 2019-09-18 DIAGNOSIS — F314 Bipolar disorder, current episode depressed, severe, without psychotic features: Secondary | ICD-10-CM | POA: Diagnosis not present

## 2019-09-18 NOTE — BH Specialist Note (Addendum)
Integrated Behavioral Health via Telemedicine Video Visit  Patient and/or legal guardian verbally consented to [facility] services about presenting concerns and psychiatric consultation as appropriate.   09/18/2019 Angel Arnold [id number]  Number of Integrated Behavioral Health visits: 1 Session Start time: 10:29 Session End time: 11:28 Total time: 60  Referring Provider: Sharen Counter, CNM Type of Visit: Video Patient/Family location: Home [facility] Provider location: [facility] All persons participating in visit: Patient Angel Arnold and [facility] [name]  Confirmed patient's address: Yes  Confirmed patient's phone number: Yes  Any changes to demographics: No   Confirmed patient's insurance: Yes  Any changes to patient's insurance: No   Discussed confidentiality: Yes   I connected with [name]  by a video enabled telemedicine application and verified that I am speaking with the correct person using two identifiers.     I discussed the limitations of evaluation and management by telemedicine and the availability of in person appointments.  I discussed that the purpose of this visit is to provide behavioral health care while limiting exposure to the novel coronavirus.   Discussed there is a possibility of technology failure and discussed alternative modes of communication if that failure occurs.  I discussed that engaging in this video visit, they consent to the provision of behavioral healthcare and the services will be billed under their insurance.  Patient and/or legal guardian expressed understanding and consented to video visit: Yes   PRESENTING CONCERNS: Patient and/or family reports the following symptoms/concerns: Pt states her primary concern is anhedonia, fatigue, poor sleep, low appetite, irritability, and ashamed to have to tell her African parents that she is pregnant out-of-wedlock again; pt was [facility] at [facility] at "14 or 15" and  medicated for bipolar disorder very briefly, and saw therapist until April 2020, and prefers to treat with no medication. Pt has had passive SI for years; denies intent or plan.   Duration of problem: Increase in pregnancy; Severity of problem: severe  STRENGTHS (Protective Factors/Coping Skills): Open to non-pharmacologic treatment  GOALS ADDRESSED: Patient will: 1.  Reduce symptoms of: anxiety, depression and stress  2.  Increase knowledge and/or ability of: coping skills, healthy habits and stress reduction  3.  Demonstrate ability to: Increase healthy adjustment to current life circumstances and Increase motivation to adhere to plan of care  INTERVENTIONS: Interventions utilized:  Mining engineer and Psychoeducation and/or Health Education Standardized Assessments completed: C-SSRS Short, GAD-7 and PHQ 9  ASSESSMENT: Patient currently experiencing Bipolar affective disorder, currently depressed, active , severe, without psychotic features   Patient may benefit from psychoeducation and brief therapeutic interventions regarding coping with symptoms of depression and anxiety related to bipolar disorder  PLAN: 1. Follow up with behavioral health clinician on : Two weeks 2. Behavioral recommendations:  -Walk to bus stop to pick up son from school on Fridays, starting today -Listen to music driving to and from bus stop to drop off/pick up son from school Mondays-Thursdays (if able to arrive early, listen to a short comedy clip in car, while waiting) -Follow Safety plan, if SI returns -Take prenatal vitamin at night or at lunchtime, instead of morning, due to nausea 3. Referral(s): Integrated [employer] (In Clinic)  I discussed the assessment and treatment plan with the patient and/or parent/guardian. They were provided an opportunity to ask questions and all were answered. They agreed with the plan and demonstrated an understanding of the instructions.   They were  advised to call back or seek an in-person evaluation  if the symptoms worsen or if the condition fails to improve as anticipated.  [name]  Depression screen [facility] 2/9 09/18/2019  Decreased Interest 3  Down, Depressed, Hopeless 1  PHQ - 2 Score 4  Altered sleeping 3  Tired, decreased energy 3  Change in appetite 3  Feeling bad or failure about yourself  1  Trouble concentrating 1  Moving slowly or fidgety/restless 2  Suicidal thoughts 1  PHQ-9 Score 18   GAD 7 : Generalized Anxiety Score 09/18/2019  Nervous, Anxious, on Edge 1  Control/stop worrying 1  Worry too much - different things 1  Trouble relaxing 1  Restless 1  Easily annoyed or irritable 3  Afraid - awful might happen 1  Total GAD 7 Score 9

## 2019-10-05 ENCOUNTER — Other Ambulatory Visit (HOSPITAL_COMMUNITY)
Admission: RE | Admit: 2019-10-05 | Discharge: 2019-10-05 | Disposition: A | Discharge status: Home / Self Care | Payer: Medicaid Other | Source: Ambulatory Visit | Attending: Obstetrics and Gynecology | Admitting: Obstetrics and Gynecology

## 2019-10-05 ENCOUNTER — Encounter: Payer: Self-pay | Admitting: Obstetrics and Gynecology

## 2019-10-05 ENCOUNTER — Ambulatory Visit (INDEPENDENT_AMBULATORY_CARE_PROVIDER_SITE_OTHER): Payer: Medicaid Other | Admitting: Obstetrics and Gynecology

## 2019-10-05 DIAGNOSIS — Z3A15 15 weeks gestation of pregnancy: Secondary | ICD-10-CM

## 2019-10-05 DIAGNOSIS — Z3493 Encounter for supervision of normal pregnancy, unspecified, third trimester: Secondary | ICD-10-CM

## 2019-10-05 DIAGNOSIS — Z3480 Encounter for supervision of other normal pregnancy, unspecified trimester: Secondary | ICD-10-CM | POA: Insufficient documentation

## 2019-10-05 DIAGNOSIS — O99341 Other mental disorders complicating pregnancy, first trimester: Secondary | ICD-10-CM

## 2019-10-05 DIAGNOSIS — F32A Depression, unspecified: Secondary | ICD-10-CM

## 2019-10-05 DIAGNOSIS — F329 Major depressive disorder, single episode, unspecified: Secondary | ICD-10-CM

## 2019-10-05 NOTE — Progress Notes (Signed)
   TELEHEALTH OBSTETRICS PRENATAL VIRTUAL VIDEO VISIT ENCOUNTER NOTE  Provider location: Center for [place] at [place]   I connected with [facility] on 10/05/19 at  3:15 PM EST by WebEx Video Encounter at home and verified that I am speaking with the correct person using two identifiers.   I discussed the limitations, risks, security and privacy concerns of performing an evaluation and management service virtually and the availability of in person appointments. I also discussed with the patient that there may be a patient responsible charge related to this service. The patient expressed understanding and agreed to proceed. Subjective:  Angel Arnold is a 23 y.o. G2P1001 at [redacted]w[redacted]d being seen today for ongoing prenatal care.  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  Patient reports decreased fetal movement and vaginitis.  Contractions: Irregular. Vag. Bleeding: None.   . Denies any leaking of fluid.   The following portions of the patient's history were reviewed and updated as appropriate: allergies, current medications, past family history, past medical history, past social history, past surgical history and problem list.   Objective:  There were no vitals filed for this visit.  Fetal Status:         FHR 160  General:  Alert, oriented and cooperative. Patient is in no acute distress.  Respiratory: Normal respiratory effort, no problems with respiration noted  Mental Status: Normal mood and affect. Normal behavior. Normal judgment and thought content.  Rest of physical exam deferred due to type of encounter  Imaging: No results found.  Assessment and Plan:  Pregnancy: G2P1001 at [redacted]w[redacted]d 1. Supervision of other normal  pregnancy, antepartum Patient reports improvement in nausea and emesis She reports increased vaginal discharge- Vaginal swab self collected  2. 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 I discussed the assessment and treatment plan with the patient. The patient was provided an opportunity to ask questions and all were answered. The patient agreed with the plan and demonstrated an understanding of the instructions. The patient was advised to call back or seek an in-person office evaluation/go to MAU at [facility] for any urgent or concerning symptoms. Please refer to After Visit Summary for other counseling recommendations.   I provided 11 minutes of face-to-face time during this encounter.  Return in about 4 weeks (around 11/02/2019) for Virtual, ROB, Low risk.  Future Appointments  Date Time Provider [place]  11/02/2019  3:15 PM Shelly Bombard, MD [place] None  11/04/2019  2:30 PM [place], MD Center for [employer], [place]

## 2019-10-05 NOTE — Progress Notes (Signed)
I connected with  Angel Arnold on 10/05/19 by a video enabled telemedicine application and verified that I am speaking with the correct person using two identifiers.   WebEx OB.  C/o pressure, cramping, discharge and reduced movements.

## 2019-10-05 NOTE — Addendum Note (Signed)
Addended by: Tamela Oddi on: 10/05/2019 04:43 PM   Modules accepted: Orders

## 2019-10-06 LAB — CERVICOVAGINAL ANCILLARY ONLY
Bacterial Vaginitis (gardnerella): POSITIVE — AB
Candida Glabrata: NEGATIVE
Candida Vaginitis: NEGATIVE
Chlamydia: NEGATIVE
Comment: NEGATIVE
Comment: NEGATIVE
Comment: NEGATIVE
Comment: NEGATIVE
Comment: NEGATIVE
Comment: NORMAL
Neisseria Gonorrhea: NEGATIVE
Trichomonas: NEGATIVE

## 2019-10-07 MED ORDER — METRONIDAZOLE 500 MG PO TABS: 500.0000 mg | ORAL_TABLET | Freq: Two times a day (BID) | ORAL | 0 refills | Status: DC

## 2019-10-07 NOTE — Addendum Note (Signed)
Addended by: Mora Bellman on: 10/07/2019 10:32 AM   Modules accepted: Orders

## 2019-10-12 ENCOUNTER — Telehealth: Payer: Self-pay

## 2019-10-12 NOTE — Telephone Encounter (Signed)
Left VM message to call Office, FMLA paperwork is ready, payment of $15.00 is required 

## 2019-10-12 NOTE — Telephone Encounter (Signed)
Left VM message to call Office, FMLA paperwork is ready, payment of $15.00 is required

## 2019-10-28 ENCOUNTER — Ambulatory Visit (HOSPITAL_COMMUNITY)
Admission: RE | Admit: 2019-10-28 | Discharge: 2019-10-28 | Disposition: A | Discharge status: Home / Self Care | Payer: Medicaid Other | Source: Ambulatory Visit | Attending: Obstetrics and Gynecology | Admitting: Obstetrics and Gynecology

## 2019-10-28 ENCOUNTER — Telehealth: Payer: Self-pay | Admitting: *Deleted

## 2019-10-28 ENCOUNTER — Other Ambulatory Visit: Payer: Medicaid Other

## 2019-10-28 ENCOUNTER — Other Ambulatory Visit: Payer: Self-pay

## 2019-10-28 ENCOUNTER — Other Ambulatory Visit (HOSPITAL_COMMUNITY): Payer: Self-pay | Admitting: *Deleted

## 2019-10-28 DIAGNOSIS — Z363 Encounter for antenatal screening for malformations: Secondary | ICD-10-CM

## 2019-10-28 DIAGNOSIS — Z362 Encounter for other antenatal screening follow-up: Secondary | ICD-10-CM

## 2019-10-28 DIAGNOSIS — Z3480 Encounter for supervision of other normal pregnancy, unspecified trimester: Secondary | ICD-10-CM | POA: Diagnosis present

## 2019-10-28 DIAGNOSIS — Z3A18 18 weeks gestation of pregnancy: Secondary | ICD-10-CM | POA: Diagnosis not present

## 2019-10-28 NOTE — Telephone Encounter (Signed)
Pt called office to change pharmacies and have Rx's transferred.   Return call to pt.  LVM making pt aware that she may contact the pharmacy she wants to use and have them transfer her current Rx to their location. Advised she may call if any other questions.

## 2019-10-29 ENCOUNTER — Other Ambulatory Visit (HOSPITAL_COMMUNITY): Payer: Medicaid Other

## 2019-10-30 LAB — AFP, SERUM, OPEN SPINA BIFIDA
AFP MoM: 0.78
AFP Value: 43.1 ng/mL
Gest. Age on Collection Date: 18.4 weeks
Maternal Age At EDD: 24.6 yr
OSBR Risk 1 IN: 10000
Test Results:: NEGATIVE
Weight: 130 [lb_av]

## 2019-11-02 ENCOUNTER — Encounter: Payer: Self-pay | Admitting: Obstetrics

## 2019-11-02 ENCOUNTER — Ambulatory Visit (INDEPENDENT_AMBULATORY_CARE_PROVIDER_SITE_OTHER): Payer: Medicaid Other | Admitting: Obstetrics

## 2019-11-02 DIAGNOSIS — Z3A19 19 weeks gestation of pregnancy: Secondary | ICD-10-CM

## 2019-11-02 DIAGNOSIS — Z8659 Personal history of other mental and behavioral disorders: Secondary | ICD-10-CM

## 2019-11-02 DIAGNOSIS — Z3480 Encounter for supervision of other normal pregnancy, unspecified trimester: Secondary | ICD-10-CM

## 2019-11-02 DIAGNOSIS — M549 Dorsalgia, unspecified: Secondary | ICD-10-CM

## 2019-11-02 DIAGNOSIS — Z3482 Encounter for supervision of other normal pregnancy, second trimester: Secondary | ICD-10-CM

## 2019-11-02 MED ORDER — COMFORT FIT MATERNITY SUPP SM MISC: 0 refills | Status: DC

## 2019-11-02 NOTE — Progress Notes (Signed)
Pt presents for Mychart for ROB. Pt identified with 2 pt identifiers. She is [redacted]w[redacted]d. Patient is not able to take bp at this time due to not having a cuff. I advised patient to follow up with pharmacy for her cuff. Pt has no other concerns.

## 2019-11-02 NOTE — Progress Notes (Signed)
TELEHEALTH OBSTETRICS PRENATAL VIRTUAL VIDEO VISIT ENCOUNTER NOTE  Provider location: [facility] for [place] at [place]   I connected with [name] on 11/02/19 at  3:15 PM EST by WebEx OB Video Encounter at home and verified that I am speaking with the correct person using two identifiers.   I discussed the limitations, risks, security and privacy concerns of performing an evaluation and management service virtually and the availability of in person appointments. I also discussed with the patient that there may be a patient responsible charge related to this service. The patient expressed understanding and agreed to proceed. Subjective:  Angel Arnold is a 23 y.o. G2P1001 at [redacted]w[redacted]d being seen today for ongoing prenatal care.  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  Patient reports backache.  Contractions: Not present. Vag. Bleeding: None.  Movement: Present. Denies any leaking of fluid.   The following portions of the patient's history were reviewed and updated as appropriate: allergies, current medications, past family history, past medical history, past social history, past surgical history and problem list.   Objective:  There were no vitals filed for this visit.  Fetal Status:     Movement: Present     General:  Alert, oriented and cooperative. Patient is in no acute distress.  Respiratory: Normal respiratory effort, no problems with respiration noted  Mental Status: Normal mood and affect. Normal behavior. Normal judgment and thought content.  Rest of physical exam deferred due to type of encounter  Imaging: [place] MFM OB COMP + 14 WK  Result Date:  10/28/2019 ----------------------------------------------------------------------  OBSTETRICS REPORT                       (Signed Final 10/28/2019 02:37 pm) ---------------------------------------------------------------------- Patient Info  ID #:       [id number]                          D.O.B.:  10-29-1996 (23 yrs)  Name:       Angel Arnold                    Visit Date: 10/28/2019 12:39 pm ---------------------------------------------------------------------- Performed By  Performed By:     Jeanene Erb BS,      Secondary Phy.:   [place]  Attending:        Tama High MD        Address:          [address]  [address]                                                             [place]                                                             [id number]  Referred By:      Gigi Gin                  [facility]         [facility]                    CONSTANT MD                              Fetal Care  Ref. Address:     Faculty ---------------------------------------------------------------------- Orders   #  Description                          Code         Ordered By   1  [place] MFM OB COMP + 14 WK               [id number]     PEGGY CONSTANT  ----------------------------------------------------------------------   #  Order #                    Accession #                 Episode #   1  [id number]                  [id number]                  [id number]  ---------------------------------------------------------------------- Indications   Encounter for antenatal screening for          Z36.3   malformations   Low Risk NIPS   [redacted] weeks gestation of pregnancy                Z3A.18  ---------------------------------------------------------------------- Fetal Evaluation  Num Of Fetuses:         1  Fetal Heart Rate(bpm):  154  Cardiac Activity:       Observed  Presentation:            Breech  Placenta:               Anterior  P. Cord Insertion:      Visualized  Amniotic Fluid  AFI FV:      Within normal limits                              Largest Pocket(cm)                              4.7 ---------------------------------------------------------------------- Biometry  BPD:      43.6  mm  G. Age:  19w 1d         76  %    CI:        75.76   %    70 - 86                                                          FL/HC:      18.6   %    16.1 - 18.3  HC:      158.8  mm     G. Age:  18w 5d         51  %    HC/AC:      1.17        1.09 - 1.39  AC:      135.9  mm     G. Age:  19w 0d         63  %    FL/BPD:     67.9   %  FL:       29.6  mm     G. Age:  19w 1d         65  %    FL/AC:      21.8   %    20 - 24  NFT:         4  mm  Est. FW:     271  gm    0 lb 10 oz      74  % ---------------------------------------------------------------------- OB History  Gravidity:    2         Term:   1        Prem:   0        SAB:   0  TOP:          0       Ectopic:  0        Living: 1 ---------------------------------------------------------------------- Gestational Age  U/S Today:     19w 0d                                        EDD:   03/23/20  Best:          18w 4d     Det. By:  Marcella Dubs         EDD:   03/26/20                                      (08/16/19) ---------------------------------------------------------------------- Anatomy  Cranium:               Appears normal         LVOT:                   Not well visualized  Cavum:                 Appears normal         Aortic Arch:            Appears normal  Ventricles:            Appears normal  Ductal Arch:            Appears normal  Choroid Plexus:        Appears normal         Diaphragm:              Appears normal  Cerebellum:            Appears normal         Stomach:                Appears normal, left                                                                        sided  Posterior Fossa:       Appears normal          Abdomen:                Appears normal  Nuchal Fold:           Appears normal         Abdominal Wall:         Appears nml (cord                                                                        insert, abd wall)  Face:                  Appears normal         Cord Vessels:           Appears normal (3                         (orbits and profile)                           vessel cord)  Lips:                  Appears normal         Kidneys:                Appear normal  Palate:                Not well visualized    Bladder:                Appears normal  Thoracic:              Appears normal         Spine:                  Appears normal  Heart:                 Not well visualized    Upper Extremities:      Appears normal  RVOT:                  Not well visualized  Lower Extremities:      Appears normal  Other:  Female gender. Heels and 5th digit visualized. Technically difficult due          to fetal position. ---------------------------------------------------------------------- Cervix Uterus Adnexa  Cervix  Length:            3.2  cm.  Normal appearance by transabdominal scan. ---------------------------------------------------------------------- Impression  We performed fetal anatomy scan. No makers of  aneuploidies or fetal structural defects are seen. Fetal  biometry is consistent with her previously-established dates.  Amniotic fluid is normal and good fetal activity is seen.  Patient understands the limitations of ultrasound in detecting  fetal anomalies.  On cell-free fetal DNA screening, the risks of fetal  aneuploidies are not increased. ---------------------------------------------------------------------- Recommendations  -An appointment was made for her to return in 5 weeks for  completion of fetal anatomy. ----------------------------------------------------------------------                  Noralee Spaceavi Shankar, MD Electronically Signed Final Report   10/28/2019 02:37 pm  ----------------------------------------------------------------------   Assessment and Plan:  Pregnancy: G2P1001 at 10474w2d  1. Supervision of other normal pregnancy, antepartum  2. History of bipolar disorder - clinically stable  3. Backache symptom Rx: - Elastic Bandages & Supports (COMFORT FIT MATERNITY SUPP SM) MISC; Wear as directed.  Dispense: 1 each; Refill: 0   Preterm labor symptoms and general obstetric precautions including but not limited to vaginal bleeding, contractions, leaking of fluid and fetal movement were reviewed in detail with the patient. I discussed the assessment and treatment plan with the patient. The patient was provided an opportunity to ask questions and all were answered. The patient agreed with the plan and demonstrated an understanding of the instructions. The patient was advised to call back or seek an in-person office evaluation/[facility] for any urgent or concerning symptoms. Please refer to After Visit Summary for other counseling recommendations.   I provided 10 minutes of face-to-face time during this encounter.  No follow-ups on file.  Future Appointments  Date Time Provider Department Center  12/02/2019 12:45 PM WH-MFC US 2 WH-MFCUS MFC-US    Coral Ceoharles Tyreon Frigon, MD [facility], [facility] 11/02/2019

## 2019-11-04 ENCOUNTER — Ambulatory Visit (HOSPITAL_COMMUNITY): Payer: Medicaid Other

## 2019-11-13 NOTE — L&D Delivery Note (Addendum)
OB/GYN Faculty Practice Delivery Note  Angel Arnold is a 24 y.o. G2P1001 s/p VBAC at [redacted]w[redacted]d. She was admitted for early labor and intermittent Cat II tracing.   ROM: 0h 105m with clear fluid GBS Status: Negative/-- (04/26 0258) Maximum Maternal Temperature: 97.75F  Labor Progress: . Initial SVE: 1/thick/-3. Patient received only 2 milliunits of Pitocin. SROM occurred and she then progressed to complete.   Delivery Date/Time: 5/10 @ [phone] Delivery: Called to room and patient was complete and pushing. Head delivered ROA. No nuchal cord present. Shoulder and body delivered in usual fashion. Infant with spontaneous cry, placed on mother's abdomen, dried and stimulated. Cord clamped x 2 after 1-minute delay, and cut by FOB. Cord blood drawn. Placenta delivered spontaneously with gentle cord traction. Fundus firm with massage and Pitocin. Labia, perineum, vagina, and cervix inspected inspected with left labial/periclitoral laceration which was repaired with 4-0 Vicryl in a standard fashion.  Baby Weight: pending  Placenta: Sent to L&D Complications: None Lacerations: left labial/periclitoral laceration EBL: 200 mL Analgesia: Epidural   Infant:  APGAR (1 MIN): 9   APGAR (5 MINS): 9   APGAR (10 MINS):     Jerilynn Birkenhead, MD OB Family Medicine Fellow, [facility] for [facility], [facility] 03/21/2020, 7:20 AM

## 2019-11-30 ENCOUNTER — Telehealth (INDEPENDENT_AMBULATORY_CARE_PROVIDER_SITE_OTHER): Payer: Medicaid Other | Admitting: Obstetrics

## 2019-11-30 ENCOUNTER — Encounter: Payer: Self-pay | Admitting: Obstetrics

## 2019-11-30 DIAGNOSIS — O26892 Other specified pregnancy related conditions, second trimester: Secondary | ICD-10-CM

## 2019-11-30 DIAGNOSIS — Z3A23 23 weeks gestation of pregnancy: Secondary | ICD-10-CM

## 2019-11-30 DIAGNOSIS — Z8659 Personal history of other mental and behavioral disorders: Secondary | ICD-10-CM

## 2019-11-30 DIAGNOSIS — Z3480 Encounter for supervision of other normal pregnancy, unspecified trimester: Secondary | ICD-10-CM

## 2019-11-30 DIAGNOSIS — M549 Dorsalgia, unspecified: Secondary | ICD-10-CM

## 2019-11-30 MED ORDER — BLOOD PRESSURE KIT DEVI: 1.0000 | 0 refills | Status: AC

## 2019-11-30 MED ORDER — COMFORT FIT MATERNITY SUPP SM MISC: 0 refills | Status: DC

## 2019-11-30 NOTE — Progress Notes (Signed)
   TELEHEALTH OBSTETRICS PRENATAL VIRTUAL VIDEO VISIT ENCOUNTER NOTE  Provider location: Center for [place] at [place]   I connected with [facility] on 11/30/19 at  3:45 PM EST by WebEx OB Video Encounter at home and verified that I am speaking with the correct person using two identifiers.   I discussed the limitations, risks, security and privacy concerns of performing an evaluation and management service virtually and the availability of in person appointments. I also discussed with the patient that there may be a patient responsible charge related to this service. The patient expressed understanding and agreed to proceed. Subjective:  Angel Arnold is a 24 y.o. G2P1001 at 52w2dbeing seen today for ongoing prenatal care.  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  Patient reports backache and pressure.  Contractions: Not present. Vag. Bleeding: None.  Movement: Present. Denies any leaking of fluid.   The following portions of the patient's history were reviewed and updated as appropriate: allergies, current medications, past family history, past medical history, past social history, past surgical history and problem list.   Objective:  There were no vitals filed for this visit.  Fetal Status:     Movement: Present     General:  Alert, oriented and cooperative. Patient is in no acute distress.  Respiratory: Normal respiratory effort, no problems with respiration noted  Mental Status: Normal mood and affect. Normal behavior. Normal judgment and thought content.  Rest of physical exam deferred due to type of encounter  Imaging: No results found.  Assessment and Plan:  Pregnancy: G2P1001 at 229w2d. Supervision of  other normal pregnancy, antepartum Rx: - Blood Pressure Monitoring (BLOOD PRESSURE KIT) DEVI; 1 kit by Does not apply route once a week. Check BP regularly.  Large Cuff.  DX: O90.0  Dispense: 1 each; Refill: 0  2. Backache symptom Rx: - Elastic Bandages & Supports (COMFORT FIT MATERNITY SUPP SM) MISC; Wear as directed.  Dispense: 1 each; Refill: 0  3. History of bipolar disorder - clinically stable  Preterm labor symptoms and general obstetric precautions including but not limited to vaginal bleeding, contractions, leaking of fluid and fetal movement were reviewed in detail with the patient. I discussed the assessment and treatment plan with the patient. The patient was provided an opportunity to ask questions and all were answered. The patient agreed with the plan and demonstrated an understanding of the instructions. The patient was advised to call back or seek an in-person office evaluation/[facility] or concerning symptoms. Please refer to After Visit Summary for other counseling recommendations.   I provided 10 minutes of face-to-face time during this encounter.  Return in about 4 weeks (around 12/28/2019) for ROB, 2 hour OGTT.  Future Appointments  Date Time Provider [place]1/20/2021 12:45 PM WH-MFC [place] WH-MFCUS MFC-US    Arlyne Brandes, [place] [facility] 11/30/2019

## 2019-11-30 NOTE — Progress Notes (Signed)
Pt is unable to take BP, pt has not yet gotten BP cuff.  BP cuff has been resent today. Pt advised to check with [facility].  Pt states that she is having increase in pelvic pressure, advised to pick up maternity support belt that was ordered.

## 2019-12-02 ENCOUNTER — Ambulatory Visit (HOSPITAL_COMMUNITY)
Admission: RE | Admit: 2019-12-02 | Discharge: 2019-12-02 | Disposition: A | Discharge status: Home / Self Care | Payer: Medicaid Other | Source: Ambulatory Visit | Attending: Obstetrics and Gynecology | Admitting: Obstetrics and Gynecology

## 2019-12-02 ENCOUNTER — Other Ambulatory Visit: Payer: Self-pay

## 2019-12-02 DIAGNOSIS — Z362 Encounter for other antenatal screening follow-up: Secondary | ICD-10-CM

## 2019-12-02 DIAGNOSIS — Z3A23 23 weeks gestation of pregnancy: Secondary | ICD-10-CM | POA: Diagnosis not present

## 2019-12-14 ENCOUNTER — Telehealth: Payer: Self-pay

## 2019-12-14 NOTE — Telephone Encounter (Signed)
Pt called and reports that she has been having BH contractions off and on for the past couple of days, they are not close together. She reports good fetal movement, denies LOF or bleeding. I advised pt to try to rest and drink plenty of water. Advised pt on preterm labor precautions and advised her to go to the hospital for evaluation should any of those symptoms occur, pt voices understanding.

## 2019-12-14 NOTE — Telephone Encounter (Signed)
Pt called and LVM asking for return phone call from nurse. Returned call, no answer. LVM for pt to call back.

## 2019-12-30 ENCOUNTER — Ambulatory Visit (INDEPENDENT_AMBULATORY_CARE_PROVIDER_SITE_OTHER): Payer: Medicaid Other | Admitting: Obstetrics & Gynecology

## 2019-12-30 ENCOUNTER — Other Ambulatory Visit: Payer: Self-pay

## 2019-12-30 ENCOUNTER — Encounter: Payer: Self-pay | Admitting: Obstetrics & Gynecology

## 2019-12-30 ENCOUNTER — Other Ambulatory Visit: Payer: Medicaid Other

## 2019-12-30 VITALS — BP 101/64 | HR 84 | Wt 144.0 lb

## 2019-12-30 DIAGNOSIS — Z3A27 27 weeks gestation of pregnancy: Secondary | ICD-10-CM

## 2019-12-30 DIAGNOSIS — Z3480 Encounter for supervision of other normal pregnancy, unspecified trimester: Secondary | ICD-10-CM

## 2019-12-30 DIAGNOSIS — O34219 Maternal care for unspecified type scar from previous cesarean delivery: Secondary | ICD-10-CM

## 2019-12-30 NOTE — Progress Notes (Signed)
Pt states she has been having N&V, cramping, dizziness for the last few weeks.  Pt states increase in pelvic pressure.

## 2019-12-30 NOTE — Progress Notes (Signed)
   PRENATAL VISIT NOTE  Subjective:  Angel Arnold is a 24 y.o. G2P1001 at [redacted]w[redacted]d being seen today for ongoing prenatal care.  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  Patient reports no complaints.  Contractions: Irritability. Vag. Bleeding: None.  Movement: Present. Denies leaking of fluid.   The following portions of the patient's history were reviewed and updated as appropriate: allergies, current medications, past family history, past medical history, past social history, past surgical history and problem list.   Objective:   Vitals:   12/30/19 0925  BP: 101/64  Pulse: 84  Weight: 144 lb (65.3 kg)    Fetal Status: Fetal Heart Rate (bpm): 160   Movement: Present     General:  Alert, oriented and cooperative. Patient is in no acute distress.  Skin: Skin is warm and dry. No rash noted.   Cardiovascular: Normal heart rate noted  Respiratory: Normal respiratory effort, no problems with respiration noted  Abdomen: Soft, gravid, appropriate for gestational age.  Pain/Pressure: Present     Pelvic: Cervical exam deferred        Extremities: Normal range of motion.     Mental Status: Normal mood and affect. Normal behavior. Normal judgment and thought content.   Assessment and Plan:  Pregnancy: G2P1001 at [redacted]w[redacted]d 1. Supervision of other normal pregnancy, antepartum Routine 28 week labs  2. Previous cesarean delivery affecting pregnancy, antepartum TOLAC vs RCS discussed  Preterm labor symptoms and general obstetric precautions including but not limited to vaginal bleeding, contractions, leaking of fluid and fetal movement were reviewed in detail with the patient.  Please refer to After Visit Summary for other counseling recommendations.   Return in about 4 weeks (around 01/27/2020) for in person sign TOLAC paper.  No future appointments.  Scheryl Darter, MD

## 2019-12-30 NOTE — Patient Instructions (Signed)
Vaginal Birth After Cesarean Delivery  Vaginal birth after cesarean delivery (VBAC) is giving birth vaginally after previously delivering a baby through a cesarean section (C-section). A VBAC may be a safe option for you, depending on your health and other factors. It is important to discuss VBAC with your health care provider early in your pregnancy so you can understand the risks, benefits, and options. Having these discussions early will give you time to make your birth plan. Who are the best candidates for VBAC? The best candidates for VBAC are women who:  Have had one or two prior cesarean deliveries, and the incision made during the delivery was horizontal (low transverse).  Do not have a vertical (classical) scar on their uterus.  Have not had a tear in the wall of their uterus (uterine rupture).  Plan to have more pregnancies. A VBAC is also more likely to be successful:  In women who have previously given birth vaginally.  When labor starts by itself (spontaneously) before the due date. What are the benefits of VBAC? The benefits of delivering your baby vaginally instead of by a cesarean delivery include:  A shorter hospital stay.  A faster recovery time.  Less pain.  Avoiding risks associated with major surgery, such as infection and blood clots.  Less blood loss and less need for donated blood (transfusions). What are the risks of VBAC? The main risk of attempting a VBAC is that it may fail, forcing your health care provider to deliver your baby by a C-section. Other risks are rare and include:  Tearing (rupture) of the scar from a past cesarean delivery.  Other risks associated with vaginal deliveries. If a repeat cesarean delivery is needed, the risks include:  Blood loss.  Infection.  Blood clot.  Damage to surrounding organs.  Removal of the uterus (hysterectomy), if it is damaged.  Placenta problems in future pregnancies. What else should I know  about my options? Delivering a baby through a VBAC is similar to having a normal spontaneous vaginal delivery. Therefore, it is safe:  To try with twins.  For your health care provider to try to turn the baby from a breech position (external cephalic version) during labor.  With epidural analgesia for pain relief. Consider where you would like to deliver your baby. VBAC should be attempted in facilities where an emergency cesarean delivery can be performed. VBAC is not recommended for home births. Any changes in your health or your baby's health during your pregnancy may make it necessary to change your initial decision about VBAC. Your health care provider may recommend that you do not attempt a VBAC if:  Your baby's suspected weight is 8.8 lb (4 kg) or more.  You have preeclampsia. This is a condition that causes high blood pressure along with other symptoms, such as swelling and headaches.  You will have VBAC less than 19 months after your cesarean delivery.  You are past your due date.  You need to have labor started (induced) because your cervix is not ready for labor (unfavorable). Where to find more information  American Pregnancy Association: americanpregnancy.org  American Congress of Obstetricians and Gynecologists: acog.org Summary  Vaginal birth after cesarean delivery (VBAC) is giving birth vaginally after previously delivering a baby through a cesarean section (C-section). A VBAC may be a safe option for you, depending on your health and other factors.  Discuss VBAC with your health care provider early in your pregnancy so you can understand the risks, benefits, options, and   have plenty of time to make your birth plan.  The main risk of attempting a VBAC is that it may fail, forcing your health care provider to deliver your baby by a C-section. Other risks are rare. This information is not intended to replace advice given to you by your health care provider. Make sure  you discuss any questions you have with your health care provider. Document Revised: 02/24/2019 Document Reviewed: 02/05/2017 Elsevier Patient Education  2020 Elsevier Inc.  

## 2019-12-31 LAB — HIV ANTIBODY (ROUTINE TESTING W REFLEX): HIV Screen 4th Generation wRfx: NONREACTIVE

## 2019-12-31 LAB — CBC
Hematocrit: 28.2 % — ABNORMAL LOW (ref 34.0–46.6)
Hemoglobin: 9.2 g/dL — ABNORMAL LOW (ref 11.1–15.9)
MCH: 28.7 pg (ref 26.6–33.0)
MCHC: 32.6 g/dL (ref 31.5–35.7)
MCV: 88 fL (ref 79–97)
Platelets: 287 10*3/uL (ref 150–450)
RBC: 3.21 x10E6/uL — ABNORMAL LOW (ref 3.77–5.28)
RDW: 12.5 % (ref 11.7–15.4)
WBC: 9.9 10*3/uL (ref 3.4–10.8)

## 2019-12-31 LAB — RPR: RPR Ser Ql: NONREACTIVE

## 2019-12-31 LAB — GLUCOSE TOLERANCE, 2 HOURS W/ 1HR
Glucose, 1 hour: 63 mg/dL — ABNORMAL LOW (ref 65–179)
Glucose, 2 hour: 91 mg/dL (ref 65–152)
Glucose, Fasting: 72 mg/dL (ref 65–91)

## 2020-01-15 ENCOUNTER — Telehealth: Payer: Self-pay | Admitting: Clinical

## 2020-01-15 NOTE — Telephone Encounter (Signed)
Left HIPPA-compliant message to call back Jamie from [facility] at [phone].   

## 2020-01-25 ENCOUNTER — Encounter: Payer: Self-pay | Admitting: Obstetrics and Gynecology

## 2020-01-25 ENCOUNTER — Other Ambulatory Visit: Payer: Self-pay

## 2020-01-25 ENCOUNTER — Ambulatory Visit (INDEPENDENT_AMBULATORY_CARE_PROVIDER_SITE_OTHER): Payer: Medicaid Other | Admitting: Obstetrics and Gynecology

## 2020-01-25 VITALS — BP 98/62 | HR 98 | Wt 144.0 lb

## 2020-01-25 DIAGNOSIS — O34219 Maternal care for unspecified type scar from previous cesarean delivery: Secondary | ICD-10-CM

## 2020-01-25 DIAGNOSIS — Z3480 Encounter for supervision of other normal pregnancy, unspecified trimester: Secondary | ICD-10-CM

## 2020-01-25 DIAGNOSIS — Z3A31 31 weeks gestation of pregnancy: Secondary | ICD-10-CM

## 2020-01-25 NOTE — Progress Notes (Signed)
   PRENATAL VISIT NOTE  Subjective:  Angel Arnold is a 24 y.o. G2P1001 at [redacted]w[redacted]d being seen today for ongoing prenatal care.  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  Patient reports no complaints.  Contractions: Irritability. Vag. Bleeding: None.  Movement: Present. Denies leaking of fluid.   The following portions of the patient's history were reviewed and updated as appropriate: allergies, current medications, past family history, past medical history, past social history, past surgical history and problem list.   Objective:   Vitals:   01/25/20 1352  BP: 98/62  Pulse: 98  Weight: 144 lb (65.3 kg)    Fetal Status: Fetal Heart Rate (bpm): 145 Fundal Height: 31 cm Movement: Present     General:  Alert, oriented and cooperative. Patient is in no acute distress.  Skin: Skin is warm and dry. No rash noted.   Cardiovascular: Normal heart rate noted  Respiratory: Normal respiratory effort, no problems with respiration noted  Abdomen: Soft, gravid, appropriate for gestational age.  Pain/Pressure: Present     Pelvic: Cervical exam deferred        Extremities: Normal range of motion.  Edema: None  Mental Status: Normal mood and affect. Normal behavior. Normal judgment and thought content.   Assessment and Plan:  Pregnancy: G2P1001 at [redacted]w[redacted]d 1. Supervision of other normal pregnancy, antepartum Patient is doing well without complaints Considering depo-provera for contraception  2. Previous cesarean delivery affecting pregnancy, antepartum Reviewed TOLAC vs RCS. Patient opted for TOLAC  Preterm labor symptoms and general obstetric precautions including but not limited  to vaginal bleeding, contractions, leaking of fluid and fetal movement were reviewed in detail with the patient. Please refer to After Visit Summary for other counseling recommendations.   Return in about 2 weeks (around 02/08/2020) for Virtual, ROB, Low risk.  No future appointments.  Catalina Antigua, MD

## 2020-02-08 ENCOUNTER — Telehealth (INDEPENDENT_AMBULATORY_CARE_PROVIDER_SITE_OTHER): Payer: Medicaid Other | Admitting: Advanced Practice Midwife

## 2020-02-08 ENCOUNTER — Encounter: Payer: Self-pay | Admitting: Advanced Practice Midwife

## 2020-02-08 DIAGNOSIS — O26893 Other specified pregnancy related conditions, third trimester: Secondary | ICD-10-CM

## 2020-02-08 DIAGNOSIS — O34219 Maternal care for unspecified type scar from previous cesarean delivery: Secondary | ICD-10-CM

## 2020-02-08 DIAGNOSIS — Z3480 Encounter for supervision of other normal pregnancy, unspecified trimester: Secondary | ICD-10-CM

## 2020-02-08 DIAGNOSIS — Z3A33 33 weeks gestation of pregnancy: Secondary | ICD-10-CM

## 2020-02-08 DIAGNOSIS — N949 Unspecified condition associated with female genital organs and menstrual cycle: Secondary | ICD-10-CM

## 2020-02-08 NOTE — Progress Notes (Signed)
   TELEHEALTH VIRTUAL OBSTETRICS VISIT ENCOUNTER NOTE  I connected with Angel Arnold on 02/08/20 at  3:40 PM EDT by MyChart virtual visit at home and verified that I am speaking with the correct person using two identifiers.   I discussed the limitations, risks, security and privacy concerns of performing an evaluation and management service by telephone and the availability of in person appointments. I also discussed with the patient that there may be a patient responsible charge related to this service. The patient expressed understanding and agreed to proceed.  Subjective:  Angel Arnold is a 24 y.o. G2P1001 at [redacted]w[redacted]d pt of [facility] being followed for ongoing prenatal care.  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  Patient reports intermittent pelvic pressure. Reports fetal movement. Denies any contractions, bleeding or leaking of fluid.   The following portions of the patient's history were reviewed and updated as appropriate: allergies, current medications, past family history, past medical history, past social history, past surgical history and problem list.   Objective:   General:  Alert, oriented and cooperative.   Mental Status: Normal mood and affect perceived. Normal judgment and thought content.  Rest of physical exam deferred due to type of encounter  Assessment and Plan:  Pregnancy: G2P1001 at [redacted]w[redacted]d 1. Supervision of other normal pregnancy, antepartum --Pt reports good fetal movement, denies regular cramping, LOF, or vaginal bleeding --Anticipatory guidance about next visits/weeks of pregnancy given. --Next visit in 3 weeks, in person for GBS  2.  Previous cesarean delivery affecting pregnancy, antepartum --Desires VBAC, consent signed  3. Pelvic pressure in pregnancy, antepartum, third trimester --Occasional contractions, less than 3/hour.  Intermittent pelvic pressure.  Worse when sitting on toilet.  May be fetal position or low station.  S/Sx of PTL reviewed.  Try hands and knees position BID to rotate baby for less rectal/pelvic pressure.    Preterm labor symptoms and general obstetric precautions including but not limited to vaginal bleeding, contractions, leaking of fluid and fetal movement were reviewed in detail with the patient.  I discussed the assessment and treatment plan with the patient. The patient was provided an opportunity to ask questions and all were answered. The patient agreed with the plan and demonstrated an understanding of the instructions. The patient was advised to call back or seek an in-person office evaluation/go to MAU at [facility] for any urgent or concerning symptoms. Please refer to After Visit Summary for other counseling recommendations.   I provided 10 minutes face-to-face time during this encounter.  Return in about 3 weeks (around 02/29/2020).  No future appointments.  Sharen Counter, CNM [facility] [employer] [facility], [facility]

## 2020-02-08 NOTE — Progress Notes (Signed)
Virtual ROB   CC:    

## 2020-02-29 ENCOUNTER — Encounter: Payer: Medicaid Other | Admitting: Obstetrics and Gynecology

## 2020-03-07 ENCOUNTER — Other Ambulatory Visit: Payer: Self-pay

## 2020-03-07 ENCOUNTER — Encounter: Payer: Self-pay | Admitting: Obstetrics and Gynecology

## 2020-03-07 ENCOUNTER — Ambulatory Visit (INDEPENDENT_AMBULATORY_CARE_PROVIDER_SITE_OTHER): Payer: Medicaid Other | Admitting: Obstetrics and Gynecology

## 2020-03-07 ENCOUNTER — Other Ambulatory Visit (HOSPITAL_COMMUNITY)
Admission: RE | Admit: 2020-03-07 | Discharge: 2020-03-07 | Disposition: A | Discharge status: Home / Self Care | Payer: Medicaid Other | Source: Ambulatory Visit | Attending: Obstetrics and Gynecology | Admitting: Obstetrics and Gynecology

## 2020-03-07 VITALS — BP 102/71 | HR 116 | Wt 153.1 lb

## 2020-03-07 DIAGNOSIS — Z3A37 37 weeks gestation of pregnancy: Secondary | ICD-10-CM

## 2020-03-07 DIAGNOSIS — Z3480 Encounter for supervision of other normal pregnancy, unspecified trimester: Secondary | ICD-10-CM | POA: Diagnosis present

## 2020-03-07 DIAGNOSIS — O34219 Maternal care for unspecified type scar from previous cesarean delivery: Secondary | ICD-10-CM

## 2020-03-07 NOTE — Progress Notes (Signed)
Pt is here for ROB/ GBS. [redacted]w[redacted]d.

## 2020-03-07 NOTE — Progress Notes (Signed)
   PRENATAL VISIT NOTE  Subjective:  Angel Arnold is a 24 y.o. G2P1001 at [redacted]w[redacted]d being seen today for ongoing prenatal care.  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  Patient reports no complaints.  Contractions: Irregular. Vag. Bleeding: None.  Movement: Present. Denies leaking of fluid.   The following portions of the patient's history were reviewed and updated as appropriate: allergies, current medications, past family history, past medical history, past social history, past surgical history and problem list.   Objective:   Vitals:   03/07/20 1450  BP: 102/71  Pulse: (!) 116  Weight: 153 lb 1.6 oz (69.4 kg)    Fetal Status: Fetal Heart Rate (bpm): 168 Fundal Height: 37 cm Movement: Present  Presentation: Vertex  General:  Alert, oriented and cooperative. Patient is in no acute distress.  Skin: Skin is warm and dry. No rash noted.   Cardiovascular: Normal heart rate noted  Respiratory: Normal respiratory effort, no problems with respiration noted  Abdomen: Soft, gravid, appropriate for gestational age.  Pain/Pressure: Present     Pelvic: Cervical exam performed in the presence of a chaperone Dilation: 1 Effacement (%): Thick Station: -3  Extremities: Normal range of motion.     Mental Status: Normal mood and affect. Normal behavior. Normal judgment and thought content.   Assessment and Plan:  Pregnancy: G2P1001 at [redacted]w[redacted]d 1. Supervision of other normal pregnancy, antepartum Patient is doing well without complaints Cultures next visit Patient is considering depo-provera Patient is still researching pediatrician - Strep Gp B NAA - Cervicovaginal ancillary only( [place])  2. Previous cesarean delivery affecting  pregnancy, antepartum Patient desires TOLAC  Term labor symptoms and general obstetric precautions including but not limited to vaginal bleeding, contractions, leaking of fluid and fetal movement were reviewed in detail with the patient. Please refer to After Visit Summary for other counseling recommendations.   Return in about 1 week (around 03/14/2020) for Virtual, ROB, Low risk.  No future appointments.  Catalina Antigua, MD

## 2020-03-08 LAB — CERVICOVAGINAL ANCILLARY ONLY
Chlamydia: NEGATIVE
Comment: NEGATIVE
Comment: NORMAL
Neisseria Gonorrhea: NEGATIVE

## 2020-03-09 LAB — STREP GP B NAA: Strep Gp B NAA: NEGATIVE

## 2020-03-14 ENCOUNTER — Encounter: Payer: Self-pay | Admitting: Obstetrics and Gynecology

## 2020-03-14 ENCOUNTER — Telehealth (HOSPITAL_BASED_OUTPATIENT_CLINIC_OR_DEPARTMENT_OTHER): Payer: Medicaid Other | Admitting: Obstetrics and Gynecology

## 2020-03-14 DIAGNOSIS — O34219 Maternal care for unspecified type scar from previous cesarean delivery: Secondary | ICD-10-CM

## 2020-03-14 DIAGNOSIS — F329 Major depressive disorder, single episode, unspecified: Secondary | ICD-10-CM

## 2020-03-14 DIAGNOSIS — Z3480 Encounter for supervision of other normal pregnancy, unspecified trimester: Secondary | ICD-10-CM

## 2020-03-14 DIAGNOSIS — N858 Other specified noninflammatory disorders of uterus: Secondary | ICD-10-CM

## 2020-03-14 DIAGNOSIS — O99341 Other mental disorders complicating pregnancy, first trimester: Secondary | ICD-10-CM

## 2020-03-14 DIAGNOSIS — O99343 Other mental disorders complicating pregnancy, third trimester: Secondary | ICD-10-CM

## 2020-03-14 DIAGNOSIS — Z3A38 38 weeks gestation of pregnancy: Secondary | ICD-10-CM

## 2020-03-14 NOTE — Progress Notes (Signed)
   OBSTETRICS PRENATAL VIRTUAL VISIT ENCOUNTER NOTE  Provider location: [facility]   I connected with [facility] on 03/14/20 at  1:45 PM EDT by MyChart Video Encounter at home and verified that I am speaking with the correct person using two identifiers.   I discussed the limitations, risks, security and privacy concerns of performing an evaluation and management service virtually and the availability of in person appointments. I also discussed with the patient that there may be a patient responsible charge related to this service. The patient expressed understanding and agreed to proceed. Subjective:  Angel Arnold is a 24 y.o. G2P1001 at [redacted]w[redacted]d being seen today for ongoing prenatal care.  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  Patient reports no complaints.  Contractions: Not present.  .  Movement: Present. Denies any leaking of fluid.   The following portions of the patient's history were reviewed and updated as appropriate: allergies, current medications, past family history, past medical history, past social history, past surgical history and problem list.   Objective:  There were no vitals filed for this visit.  Fetal Status:     Movement: Present     General:  Alert, oriented and cooperative. Patient is in no acute distress.  Respiratory: Normal respiratory effort, no problems with respiration noted  Mental Status: Normal mood and affect. Normal behavior. Normal judgment and thought content.  Rest of physical exam deferred due to type of encounter  Imaging: No results found.  Assessment and Plan:  Pregnancy: G2P1001 at [redacted]w[redacted]d 1. Supervision of other normal pregnancy, antepartum Patient is  doing well without complaints Results of cultures reviewed  2. Previous cesarean delivery affecting pregnancy, antepartum Desires TOLAC Discussed IOL at 41 weeks. Will schedule at next visit  3. 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 I discussed the assessment and treatment plan with the patient. The patient was provided an opportunity to ask questions and all were answered. The patient agreed with the plan and demonstrated an understanding of the instructions. The patient was advised to call back or seek an in-person office evaluation/go to MAU at [facility] for any urgent or concerning symptoms. Please refer to After Visit Summary for other counseling recommendations.   I provided 11 minutes of face-to-face time during this encounter.  No follow-ups on file.  No future appointments.  Catalina Antigua, MD [facility] [employer], [facility]

## 2020-03-21 ENCOUNTER — Inpatient Hospital Stay (HOSPITAL_COMMUNITY)
Admission: AD | Admit: 2020-03-21 | Discharge: 2020-03-23 | DRG: 807 | Disposition: A | Discharge status: Home / Self Care | Payer: Medicaid Other | Attending: Obstetrics & Gynecology | Admitting: Obstetrics & Gynecology

## 2020-03-21 ENCOUNTER — Other Ambulatory Visit: Payer: Self-pay

## 2020-03-21 ENCOUNTER — Inpatient Hospital Stay (HOSPITAL_COMMUNITY): Payer: Medicaid Other | Admitting: Anesthesiology

## 2020-03-21 ENCOUNTER — Encounter (HOSPITAL_COMMUNITY): Payer: Self-pay

## 2020-03-21 ENCOUNTER — Telehealth: Payer: Medicaid Other

## 2020-03-21 DIAGNOSIS — O9902 Anemia complicating childbirth: Secondary | ICD-10-CM | POA: Diagnosis present

## 2020-03-21 DIAGNOSIS — Z20822 Contact with and (suspected) exposure to covid-19: Secondary | ICD-10-CM | POA: Diagnosis present

## 2020-03-21 DIAGNOSIS — O34219 Maternal care for unspecified type scar from previous cesarean delivery: Secondary | ICD-10-CM | POA: Diagnosis present

## 2020-03-21 DIAGNOSIS — O4202 Full-term premature rupture of membranes, onset of labor within 24 hours of rupture: Secondary | ICD-10-CM

## 2020-03-21 DIAGNOSIS — O34211 Maternal care for low transverse scar from previous cesarean delivery: Secondary | ICD-10-CM

## 2020-03-21 DIAGNOSIS — Z3A39 39 weeks gestation of pregnancy: Secondary | ICD-10-CM

## 2020-03-21 DIAGNOSIS — F329 Major depressive disorder, single episode, unspecified: Secondary | ICD-10-CM | POA: Diagnosis present

## 2020-03-21 DIAGNOSIS — O26893 Other specified pregnancy related conditions, third trimester: Secondary | ICD-10-CM

## 2020-03-21 DIAGNOSIS — F32A Depression, unspecified: Secondary | ICD-10-CM | POA: Diagnosis present

## 2020-03-21 DIAGNOSIS — O99341 Other mental disorders complicating pregnancy, first trimester: Secondary | ICD-10-CM | POA: Diagnosis present

## 2020-03-21 DIAGNOSIS — Z8659 Personal history of other mental and behavioral disorders: Secondary | ICD-10-CM

## 2020-03-21 LAB — CBC WITH DIFFERENTIAL/PLATELET
Abs Immature Granulocytes: 0.08 10*3/uL — ABNORMAL HIGH (ref 0.00–0.07)
Basophils Absolute: 0.1 10*3/uL (ref 0.0–0.1)
Basophils Relative: 1 %
Eosinophils Absolute: 0.1 10*3/uL (ref 0.0–0.5)
Eosinophils Relative: 1 %
HCT: 31.1 % — ABNORMAL LOW (ref 36.0–46.0)
Hemoglobin: 9.3 g/dL — ABNORMAL LOW (ref 12.0–15.0)
Immature Granulocytes: 1 %
Lymphocytes Relative: 19 %
Lymphs Abs: 2.5 10*3/uL (ref 0.7–4.0)
MCH: 24.8 pg — ABNORMAL LOW (ref 26.0–34.0)
MCHC: 29.9 g/dL — ABNORMAL LOW (ref 30.0–36.0)
MCV: 82.9 fL (ref 80.0–100.0)
Monocytes Absolute: 1 10*3/uL (ref 0.1–1.0)
Monocytes Relative: 8 %
Neutro Abs: 9.3 10*3/uL — ABNORMAL HIGH (ref 1.7–7.7)
Neutrophils Relative %: 70 %
Platelets: 304 10*3/uL (ref 150–400)
RBC: 3.75 MIL/uL — ABNORMAL LOW (ref 3.87–5.11)
RDW: 16.5 % — ABNORMAL HIGH (ref 11.5–15.5)
WBC: 13 10*3/uL — ABNORMAL HIGH (ref 4.0–10.5)
nRBC: 0 % (ref 0.0–0.2)

## 2020-03-21 LAB — URINALYSIS, ROUTINE W REFLEX MICROSCOPIC
Bilirubin Urine: NEGATIVE
Glucose, UA: NEGATIVE mg/dL
Ketones, ur: NEGATIVE mg/dL
Nitrite: NEGATIVE
Protein, ur: NEGATIVE mg/dL
Specific Gravity, Urine: 1.005 (ref 1.005–1.030)
pH: 6 (ref 5.0–8.0)

## 2020-03-21 LAB — COMPREHENSIVE METABOLIC PANEL
ALT: 10 U/L (ref 0–44)
AST: 25 U/L (ref 15–41)
Albumin: 3.1 g/dL — ABNORMAL LOW (ref 3.5–5.0)
Alkaline Phosphatase: 171 U/L — ABNORMAL HIGH (ref 38–126)
Anion gap: 12 (ref 5–15)
BUN: 9 mg/dL (ref 6–20)
CO2: 18 mmol/L — ABNORMAL LOW (ref 22–32)
Calcium: 8.9 mg/dL (ref 8.9–10.3)
Chloride: 107 mmol/L (ref 98–111)
Creatinine, Ser: 0.59 mg/dL (ref 0.44–1.00)
GFR calc Af Amer: >60 mL/min (ref >60)
GFR calc non Af Amer: >60 mL/min (ref >60)
Glucose, Bld: 91 mg/dL (ref 70–99)
Potassium: 4 mmol/L (ref 3.5–5.1)
Sodium: 137 mmol/L (ref 135–145)
Total Bilirubin: 1 mg/dL (ref 0.3–1.2)
Total Protein: 6.8 g/dL (ref 6.5–8.1)

## 2020-03-21 LAB — RESPIRATORY PANEL BY RT PCR (FLU A&B, COVID)
Influenza A by PCR: NEGATIVE
Influenza B by PCR: NEGATIVE
SARS Coronavirus 2 by RT PCR: NEGATIVE

## 2020-03-21 LAB — TYPE AND SCREEN
ABO/RH(D): A POS
Antibody Screen: NEGATIVE

## 2020-03-21 LAB — PROTEIN / CREATININE RATIO, URINE
Creatinine, Urine: 35.54 mg/dL
Total Protein, Urine: <6 mg/dL

## 2020-03-21 LAB — RPR: RPR Ser Ql: NONREACTIVE

## 2020-03-21 MED ORDER — FENTANYL CITRATE (PF) 100 MCG/2ML IJ SOLN: 100.0000 ug | INTRAMUSCULAR | Status: DC | PRN

## 2020-03-21 MED ORDER — PHENYLEPHRINE 40 MCG/ML (10ML) SYRINGE FOR IV PUSH (FOR BLOOD PRESSURE SUPPORT): 80.0000 ug | PREFILLED_SYRINGE | INTRAVENOUS | Status: DC | PRN

## 2020-03-21 MED ORDER — SENNOSIDES-DOCUSATE SODIUM 8.6-50 MG PO TABS
2.0000 | ORAL_TABLET | ORAL | Status: DC
  Administered 2020-03-21 – 2020-03-22 (×2): 2 via ORAL
  Filled 2020-03-21 (×2): qty 2

## 2020-03-21 MED ORDER — MEASLES, MUMPS & RUBELLA VAC IJ SOLR: 0.5000 mL | Freq: Once | INTRAMUSCULAR | Status: DC

## 2020-03-21 MED ORDER — ONDANSETRON HCL 4 MG/2ML IJ SOLN: 4.0000 mg | INTRAMUSCULAR | Status: DC | PRN

## 2020-03-21 MED ORDER — COCONUT OIL OIL: 1.0000 "application " | TOPICAL_OIL | Status: DC | PRN

## 2020-03-21 MED ORDER — FENTANYL-BUPIVACAINE-NACL 0.5-0.125-0.9 MG/250ML-% EP SOLN
12.0000 mL/h | EPIDURAL | Status: DC | PRN
  Filled 2020-03-21: qty 250

## 2020-03-21 MED ORDER — LIDOCAINE HCL (PF) 1 % IJ SOLN
INTRAMUSCULAR | Status: DC | PRN
  Administered 2020-03-21 (×2): 5 mL via EPIDURAL

## 2020-03-21 MED ORDER — LACTATED RINGERS IV SOLN: 500.0000 mL | Freq: Once | INTRAVENOUS | Status: DC

## 2020-03-21 MED ORDER — LIDOCAINE HCL (PF) 1 % IJ SOLN: 30.0000 mL | INTRAMUSCULAR | Status: DC | PRN

## 2020-03-21 MED ORDER — TERBUTALINE SULFATE 1 MG/ML IJ SOLN: 0.2500 mg | Freq: Once | INTRAMUSCULAR | Status: DC | PRN

## 2020-03-21 MED ORDER — PHENYLEPHRINE 40 MCG/ML (10ML) SYRINGE FOR IV PUSH (FOR BLOOD PRESSURE SUPPORT)
80.0000 ug | PREFILLED_SYRINGE | INTRAVENOUS | Status: DC | PRN
  Filled 2020-03-21: qty 10

## 2020-03-21 MED ORDER — DIPHENHYDRAMINE HCL 50 MG/ML IJ SOLN: 12.5000 mg | INTRAMUSCULAR | Status: DC | PRN

## 2020-03-21 MED ORDER — SIMETHICONE 80 MG PO CHEW: 80.0000 mg | CHEWABLE_TABLET | ORAL | Status: DC | PRN

## 2020-03-21 MED ORDER — LACTATED RINGERS IV SOLN: INTRAVENOUS | Status: DC

## 2020-03-21 MED ORDER — OXYTOCIN 40 UNITS IN NORMAL SALINE INFUSION - SIMPLE MED
1.0000 m[IU]/min | INTRAVENOUS | Status: DC
  Administered 2020-03-21: 2 m[IU]/min via INTRAVENOUS
  Filled 2020-03-21: qty 1000

## 2020-03-21 MED ORDER — ACETAMINOPHEN 325 MG PO TABS
650.0000 mg | ORAL_TABLET | Freq: Four times a day (QID) | ORAL | Status: DC | PRN
  Administered 2020-03-22 – 2020-03-23 (×2): 650 mg via ORAL
  Filled 2020-03-21 (×2): qty 2

## 2020-03-21 MED ORDER — LACTATED RINGERS IV BOLUS
1000.0000 mL | Freq: Once | INTRAVENOUS | Status: AC
  Administered 2020-03-21: 1000 mL via INTRAVENOUS

## 2020-03-21 MED ORDER — ACETAMINOPHEN 325 MG PO TABS: 650.0000 mg | ORAL_TABLET | ORAL | Status: DC | PRN

## 2020-03-21 MED ORDER — TETANUS-DIPHTH-ACELL PERTUSSIS 5-2.5-18.5 LF-MCG/0.5 IM SUSP: 0.5000 mL | Freq: Once | INTRAMUSCULAR | Status: DC

## 2020-03-21 MED ORDER — SOD CITRATE-CITRIC ACID 500-334 MG/5ML PO SOLN: 30.0000 mL | ORAL | Status: DC | PRN

## 2020-03-21 MED ORDER — EPHEDRINE 5 MG/ML INJ: 10.0000 mg | INTRAVENOUS | Status: DC | PRN

## 2020-03-21 MED ORDER — WITCH HAZEL-GLYCERIN EX PADS: 1.0000 "application " | MEDICATED_PAD | CUTANEOUS | Status: DC | PRN

## 2020-03-21 MED ORDER — SODIUM CHLORIDE (PF) 0.9 % IJ SOLN
INTRAMUSCULAR | Status: DC | PRN
  Administered 2020-03-21: 12 mL/h via EPIDURAL

## 2020-03-21 MED ORDER — LACTATED RINGERS IV SOLN
500.0000 mL | Freq: Once | INTRAVENOUS | Status: AC
  Administered 2020-03-21: 05:00:00 500 mL via INTRAVENOUS

## 2020-03-21 MED ORDER — PRENATAL MULTIVITAMIN CH
1.0000 | ORAL_TABLET | Freq: Every day | ORAL | Status: DC
  Administered 2020-03-21 – 2020-03-22 (×2): 1 via ORAL
  Filled 2020-03-21 (×2): qty 1

## 2020-03-21 MED ORDER — LACTATED RINGERS IV SOLN: 500.0000 mL | INTRAVENOUS | Status: DC | PRN

## 2020-03-21 MED ORDER — DIBUCAINE (PERIANAL) 1 % EX OINT: 1.0000 "application " | TOPICAL_OINTMENT | CUTANEOUS | Status: DC | PRN

## 2020-03-21 MED ORDER — BENZOCAINE-MENTHOL 20-0.5 % EX AERO: 1.0000 "application " | INHALATION_SPRAY | CUTANEOUS | Status: DC | PRN

## 2020-03-21 MED ORDER — ONDANSETRON HCL 4 MG/2ML IJ SOLN: 4.0000 mg | Freq: Four times a day (QID) | INTRAMUSCULAR | Status: DC | PRN

## 2020-03-21 MED ORDER — DIPHENHYDRAMINE HCL 25 MG PO CAPS: 25.0000 mg | ORAL_CAPSULE | Freq: Four times a day (QID) | ORAL | Status: DC | PRN

## 2020-03-21 MED ORDER — OXYTOCIN BOLUS FROM INFUSION
500.0000 mL | Freq: Once | INTRAVENOUS | Status: AC
  Administered 2020-03-21: 07:00:00 500 mL via INTRAVENOUS

## 2020-03-21 MED ORDER — IBUPROFEN 600 MG PO TABS
600.0000 mg | ORAL_TABLET | Freq: Three times a day (TID) | ORAL | Status: DC | PRN
  Administered 2020-03-21 – 2020-03-22 (×5): 600 mg via ORAL
  Filled 2020-03-21 (×5): qty 1

## 2020-03-21 MED ORDER — OXYTOCIN 40 UNITS IN NORMAL SALINE INFUSION - SIMPLE MED: 2.5000 [IU]/h | INTRAVENOUS | Status: DC

## 2020-03-21 MED ORDER — SODIUM CHLORIDE 0.9 % IV BOLUS: 1000.0000 mL | Freq: Once | INTRAVENOUS | Status: DC

## 2020-03-21 MED ORDER — ONDANSETRON HCL 4 MG PO TABS: 4.0000 mg | ORAL_TABLET | ORAL | Status: DC | PRN

## 2020-03-21 NOTE — Progress Notes (Signed)
Labor Progress Note Angel Arnold is a 24 y.o. G2P1001 at [redacted]w[redacted]d presented for SOL and Cat II tracing noted.  S: Feeling ctx more frequently now.   O:  BP 118/66   Pulse 86   Temp 97.8 F (36.6 C) (Oral)   Resp 17   Ht 5\' 2"  (1.575 m)   Wt 69.4 kg   LMP  (LMP Unknown)   SpO2 100%   BMI 27.98 kg/m  EFM: 145, moderate variability, no accels, infrequent late decels TOCO: q36m  CVE: Dilation: 3.5 Effacement (%): 50 Station: -2 Presentation: Vertex Exam by:: Dr. [email or web address]    A&P: 24 y.o. G2P1001 [redacted]w[redacted]d here for SOL and subsequently IOL/agumentation for Cat II tracing.  #Labor: Progressing well. Went to place FB and now 3.5 cm with improvement in effacement. Will start Pit. Max 20 unless IUPC in place. AROM PRN. Anticipate VBAC. #Pain: per patient request #FWB: Cat II; reassuring for moderate variability #GBS negative  [redacted]w[redacted]d, MD 3:45 AM

## 2020-03-21 NOTE — Anesthesia Preprocedure Evaluation (Signed)
Anesthesia Evaluation  Patient identified by MRN, date of birth, ID band Patient awake    Reviewed: Allergy & Precautions, H&P , NPO status , Patient's Chart, lab work & pertinent test results  Airway Mallampati: II   Neck ROM: full    Dental   Pulmonary neg pulmonary ROS,    breath sounds clear to auscultation       Cardiovascular negative cardio ROS   Rhythm:regular Rate:Normal     Neuro/Psych    GI/Hepatic   Endo/Other    Renal/GU      Musculoskeletal   Abdominal   Peds  Hematology  (+) Blood dyscrasia, anemia ,   Anesthesia Other Findings   Reproductive/Obstetrics (+) Pregnancy                             Anesthesia Physical Anesthesia Plan  ASA: II  Anesthesia Plan: Epidural   Post-op Pain Management:    Induction: Intravenous  PONV Risk Score and Plan: 2 and Treatment may vary due to age or medical condition  Airway Management Planned: Natural Airway  Additional Equipment:   Intra-op Plan:   Post-operative Plan:   Informed Consent: I have reviewed the patients History and Physical, chart, labs and discussed the procedure including the risks, benefits and alternatives for the proposed anesthesia with the patient or authorized representative who has indicated his/her understanding and acceptance.       Plan Discussed with: Anesthesiologist  Anesthesia Plan Comments:         Anesthesia Quick Evaluation

## 2020-03-21 NOTE — MAU Note (Signed)
Pt reports to MAU c/o ctx every 5 min. No bleeding or LOF. +FM.  

## 2020-03-21 NOTE — ED Notes (Signed)
OB MD contacted, plan to DC pt after as long as there is no active labor.

## 2020-03-21 NOTE — ED Notes (Signed)
Pt is G2P1, first child CS

## 2020-03-21 NOTE — ED Notes (Signed)
Pt transported to MAU via wheelchair by [facility]

## 2020-03-21 NOTE — Anesthesia Postprocedure Evaluation (Signed)
Anesthesia Post Note  Patient: Angel Arnold  Procedure(s) Performed: AN AD HOC LABOR EPIDURAL     Patient location during evaluation: Mother Baby Anesthesia Type: Epidural Level of consciousness: awake and alert Pain management: pain level controlled Vital Signs Assessment: post-procedure vital signs reviewed and stable Respiratory status: spontaneous breathing, nonlabored ventilation and respiratory function stable Cardiovascular status: stable Postop Assessment: no headache, no backache and epidural receding Anesthetic complications: no    Last Vitals:  Vitals:   03/21/20 0800 03/21/20 0840  BP: 101/68 (!) 100/54  Pulse: (!) 58 72  Resp: 16 18  Temp:  36.5 C  SpO2:      Last Pain:  Vitals:   03/21/20 0840  TempSrc: Oral  PainSc:    Pain Goal: Patients Stated Pain Goal: 0 (03/21/20 0122)              Epidural/Spinal Function Cutaneous sensation: Able to Wiggle Toes (03/21/20 0840), Patient able to flex knees: Yes (03/21/20 0840), Patient able to lift hips off bed: Yes (03/21/20 0840), Back pain beyond tenderness at insertion site: No (03/21/20 0840), Progressively worsening motor and/or sensory loss: No (03/21/20 0840), Bowel and/or bladder incontinence post epidural: No (03/21/20 0840)  Marquesa Rath

## 2020-03-21 NOTE — ED Triage Notes (Signed)
Pt came in by POV for labor pain/contractions. MD Rancour & [facility] at bedside upon arrival. NAD noted, VSS

## 2020-03-21 NOTE — ED Notes (Signed)
MD Rancour manually checked pt, states pt is 2p1. [facility] at bedside

## 2020-03-21 NOTE — ED Provider Notes (Signed)
[facility] EMERGENCY DEPARTMENT Provider Note   CSN: [id number] Arrival date & time: 03/21/20  0025     History No chief complaint on file.   Angel Arnold is a 24 y.o. female.  Patient G2, P1 at [redacted] weeks gestation presenting with contractions since approximately 8 PM.  States she been having contractions on and off all day but the became more severe and regular every 4 minutes at about 8 PM.  Denies any vaginal bleeding or gush of fluid.  Contractions come and go and are sharp and stabbing.  She has had no nausea or vomiting.  She has had no chest pain or back pain.  She sees Dr. Elly Modena for this pregnancy and has not had any issues with this pregnancy.  She had a C-section with her first pregnancy due to breech presentation was supposed to have vaginal delivery this pregnancy.  The history is provided by the patient.       Past Medical History:  Diagnosis Date  . Anemia     Patient Active Problem List   Diagnosis Date Noted  . Previous cesarean delivery affecting pregnancy, antepartum 12/30/2019  . 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 History of bipolar disorder 09/08/2019  . Supervision of other normal pregnancy, antepartum 09/07/2019    Past Surgical History:  Procedure Laterality Date  . CESAREAN SECTION       OB History    Gravida  2   Para  1   Term  1   Preterm  0   AB  0   Living  1     SAB  0   TAB  0   Ectopic  0   Multiple  0   Live Births  1           History reviewed. No pertinent family history.  Social History   Tobacco Use  . Smoking status: Never Smoker  . Smokeless tobacco: Never Used  Substance Use Topics  . Alcohol use: Not Currently  . Drug use: Never    Home Medications Prior to Admission medications   Medication Sig Start Date End Date Taking? Authorizing Provider  Blood Pressure Monitoring (BLOOD PRESSURE KIT) DEVI 1 kit by Does not apply route once a week. Check BP  regularly.  Large Cuff.  DX: O90.0 Patient not taking: Reported on 03/14/2020 11/30/19   Shelly Bombard, MD  Doxylamine-Pyridoxine (DICLEGIS) 10-10 MG TBEC Take 2 tabs at bedtime. If needed, add another tab in the morning. If needed, add another tab in the afternoon, up to 4 tabs/day. Patient not taking: Reported on 03/07/2020 09/07/19   Elvera Maria, CNM  Elastic Bandages & Supports (COMFORT FIT MATERNITY SUPP SM) MISC Wear as directed. Patient not taking: Reported on 03/07/2020 11/30/19   Shelly Bombard, MD  ondansetron (ZOFRAN ODT) 4 MG disintegrating tablet Take 1 tablet (4 mg total) by mouth every 6 (six) hours as needed for nausea. Patient not taking: Reported on 03/07/2020 09/07/19   Elvera Maria, CNM  Prenatal Vit-Fe Phos-FA-Omega (VITAFOL GUMMIES) 3.33-0.333-34.8 MG CHEW Chew 1 tablet by mouth daily. 09/07/19   Leftwich-Kirby, Kathie Dike, CNM    Allergies    Patient has no known allergies.  Review of Systems   Review of Systems  Constitutional: Negative for activity change, appetite change and fever.  HENT: Negative for congestion.   Eyes: Negative for visual disturbance.  Respiratory: Negative for cough, chest tightness and shortness of breath.  Cardiovascular: Negative for chest pain.  Gastrointestinal: Positive for abdominal pain. Negative for vomiting.  Genitourinary: Negative for dysuria, vaginal bleeding and vaginal discharge.  Musculoskeletal: Negative for arthralgias.  Skin: Negative for rash.  Neurological: Negative for dizziness, weakness and headaches.   all other systems are negative except as noted in the HPI and PMH.    Physical Exam Updated Vital Signs BP (!) 128/93 (BP Location: Right Arm)   Pulse 81   Temp (!) 96.3 F (35.7 C) (Tympanic)   Resp 19   LMP  (LMP Unknown)   SpO2 100%   Physical Exam Vitals and nursing note reviewed.  Constitutional:      General: She is not in acute distress.    Appearance: She is well-developed.      Comments: Uncomfortable  HENT:     Head: Normocephalic and atraumatic.     Mouth/Throat:     Pharynx: No oropharyngeal exudate.  Eyes:     Conjunctiva/sclera: Conjunctivae normal.     Pupils: Pupils are equal, round, and reactive to light.  Neck:     Comments: No meningismus. Cardiovascular:     Rate and Rhythm: Normal rate and regular rhythm.     Heart sounds: Normal heart sounds. No murmur.  Pulmonary:     Effort: Pulmonary effort is normal. No respiratory distress.     Breath sounds: Normal breath sounds.  Abdominal:     Palpations: Abdomen is soft.     Tenderness: There is no abdominal tenderness. There is no guarding or rebound.     Comments: Gravid abdomen to xiphoid process  Genitourinary:    Comments: Chaperone present.  There is no vaginal bleeding or fluid in the vaginal vault.  Cervix is high approximately 1 to 2 cm dilated. Musculoskeletal:        General: No tenderness. Normal range of motion.     Cervical back: Normal range of motion and neck supple.  Skin:    General: Skin is warm.  Neurological:     Mental Status: She is alert and oriented to person, place, and time.     Cranial Nerves: No cranial nerve deficit.     Motor: No abnormal muscle tone.     Coordination: Coordination normal.     Comments:  5/5 strength throughout. CN 2-12 intact.Equal grip strength.   Psychiatric:        Behavior: Behavior normal.     ED Results / Procedures / Treatments   Labs (all labs ordered are listed, but only abnormal results are displayed) Labs Reviewed  CBC WITH DIFFERENTIAL/PLATELET - Abnormal; Notable for the following components:      Result Value   WBC 13.0 (*)    RBC 3.75 (*)    Hemoglobin 9.3 (*)    HCT 31.1 (*)    MCH 24.8 (*)    MCHC 29.9 (*)    RDW 16.5 (*)    Neutro Abs 9.3 (*)    Abs Immature Granulocytes 0.08 (*)    All other components within normal limits  COMPREHENSIVE METABOLIC PANEL - Abnormal; Notable for the following components:   CO2 18  (*)    Albumin 3.1 (*)    Alkaline Phosphatase 171 (*)    All other components within normal limits  URINALYSIS, ROUTINE W REFLEX MICROSCOPIC - Abnormal; Notable for the following components:   Color, Urine STRAW (*)    Hgb urine dipstick MODERATE (*)    Leukocytes,Ua TRACE (*)    Bacteria, UA RARE (*)  All other components within normal limits  RESPIRATORY PANEL BY RT PCR (FLU A&B, COVID)  PROTEIN / CREATININE RATIO, URINE  RPR  TYPE AND SCREEN    EKG None  Radiology No results found.  Procedures .Critical Care Performed by: Ezequiel Essex, MD Authorized by: Ezequiel Essex, MD   Critical care provider statement:    Critical care time (minutes):  32   Critical care was necessary to treat or prevent imminent or life-threatening deterioration of the following conditions: labor.   Critical care was time spent personally by me on the following activities:  Discussions with consultants, evaluation of patient's response to treatment, examination of patient, ordering and performing treatments and interventions, ordering and review of laboratory studies, ordering and review of radiographic studies, pulse oximetry, re-evaluation of patient's condition, obtaining history from patient or surrogate and review of old charts   (including critical care time)  Medications Ordered in ED Medications  sodium chloride 0.9 % bolus 1,000 mL (has no administration in time range)  lactated ringers bolus 1,000 mL (has no administration in time range)    ED Course  I have reviewed the triage vital signs and the nursing notes.  Pertinent labs & imaging results that were available during my care of the patient were reviewed by me and considered in my medical decision making (see chart for details).    MDM Rules/Calculators/A&P                     G2, P1 at [redacted] weeks gestation here with concern for active labor.  She reports contractions every 4 minutes since 8 PM.  No vaginal bleeding or  gush of fluid.  Cervix is minimally dilated and high.  Rapid OB at bedside.  Patient placed on fetal monitor.  She is having contractions every 4 minutes.  Fetal heart rate in the 140s.  OB nurse has d/w Dr. Elonda Husky of obstetrics.  They agree to accept patient to MAU for labor check. Labs are pending.  Blood pressure is mildly elevated on arrival but repeat is 124/79  Labs show stable anemia.  LFTs are normal.  No proteinuria.  Urine protein /reatinine ratio is normal.  Blood pressure has normalized.  She denies headache or vision changes.  Patient taken to MAU for labor check with rapid response nurse. Final Clinical Impression(s) / ED Diagnoses Final diagnoses:  Abdominal pain during pregnancy in third trimester    Rx / DC Orders ED Discharge Orders    None       Chivas Notz, Annie Main, MD 03/21/20 [phone]

## 2020-03-21 NOTE — Discharge Summary (Signed)
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 yo G2P1001 at 328w2das admitted in Latent Labor on 03/21/2020. Patient had an uncomplicated labor course as follows: Initial SVE: 1/thick/-3 and patient admitted for intermittent Cat II tracing. Patient received only 2 milliunits of Pitocin. SROM occurred and she then progressed to complete.  Membrane Rupture Time/Date: 5:56 AM ,03/21/2020   Intrapartum Procedures: Episiotomy: None [1]                                         Lacerations:  Labial [10]  Patient had a delivery of a Viable infant. 03/21/2020  Information for the patient's newborn:  [place][id number][place]Delivery Method: Vag-Spont     Pateint had an uncomplicated postpartum course.  She is ambulating, tolerating a regular diet, passing flatus, and urinating well. Patient is discharged home in stable condition on 03/23/20.  Delivery time: 6:29 AM    Magnesium Sulfate received: No BMZ received: No Rhophylac:No MMR:No Transfusion:No  Physical exam  Vitals:   03/22/20 1350 03/22/20 2141 03/23/20 0549 03/23/20 0557  BP: 112/67 109/75 115/78   Pulse: 84 87 68   Resp: 18  18    Temp: 98.3 F (36.8 C) 98.8 F (37.1 C)  98.1 F (36.7 C)  TempSrc: Oral Oral  Oral  SpO2:  100% 100%   Weight:      Height:       General: alert, cooperative and no distress Lochia: appropriate Uterine Fundus: firm Incision: N/A DVT Evaluation: No evidence of DVT seen on physical exam. Labs: Lab Results  Component Value Date   WBC 13.0 (H) 03/21/2020   HGB 9.3 (L) 03/21/2020   HCT 31.1 (L) 03/21/2020   MCV 82.9 03/21/2020   PLT 304 03/21/2020   CMP Latest Ref Rng & Units 03/21/2020  Glucose 70 - 99 mg/dL 91  BUN 6 - 20 mg/dL 9  Creatinine 0.44 - 1.00 mg/dL 0.59  Sodium 135 - 145 mmol/L 137  Potassium 3.5 - 5.1 mmol/L 4.0  Chloride 98 - 111 mmol/L 107  CO2 22 - 32 mmol/L 18(L)  Calcium 8.9 - 10.3 mg/dL 8.9  Total Protein 6.5 -  8.1 g/dL 6.8  Total Bilirubin 0.3 - 1.2 mg/dL 1.0  Alkaline Phos 38 - 126 U/L 171(H)  AST 15 - 41 U/L 25  ALT 0 - 44 U/L 10   Edinburgh Score: Edinburgh Postnatal Depression Scale Screening Tool 03/22/2020  I have been able to laugh and see the funny side of things. 0  I have looked forward with enjoyment to things. 0  I have blamed myself unnecessarily when things went wrong. 1  I have been anxious or worried for no good reason. 1  I have felt scared or panicky for no good reason. 0  Things have been getting on top of me. 0  I have been so unhappy that I have had difficulty sleeping. 0  I have felt sad or miserable. 0  I have been so unhappy that I have been crying. 0  The thought of harming myself has occurred to me. 0  Edinburgh Postnatal Depression Scale Total 2    Discharge instruction: per After Visit Summary and "Baby and Me Booklet".  After visit meds:  Allergies as of 03/23/2020   No Known Allergies     Medication List    STOP taking these medications   Comfort Fit Maternity Supp Sm Misc   Doxylamine-Pyridoxine 10-10 MG Tbec Commonly known as: Diclegis   ondansetron 4 MG disintegrating tablet Commonly known as: Zofran  ODT     TAKE these medications   acetaminophen 325 MG tablet Commonly known as: Tylenol Take 2 tablets (650 mg total) by mouth every 6 (six) hours as needed (for pain scale < 4).   Blood Pressure Kit Devi 1 kit by Does not apply route once a week. Check BP regularly.  Large Cuff.  DX: O90.0   ibuprofen 600 MG tablet Commonly known as: ADVIL Take 1 tablet (600 mg total) by mouth every 8 (eight) hours as needed for mild pain.   senna-docusate 8.6-50 MG tablet Commonly known as: Senokot-S Take 2 tablets by mouth daily. Start taking on: Mar 24, 2020   Vitafol Gummies 3.33-0.333-34.8 MG Chew Chew 1 tablet by mouth daily.       Diet: routine diet  Activity: Advance as tolerated. Pelvic rest for 6 weeks.   Outpatient follow up:4 weeks Follow up Appt: Future Appointments  Date Time Provider [place]  05/02/2020  2:00 PM Constant, Vickii Chafe, MD [place] None   Follow up Visit:    Please schedule this patient for Postpartum visit in: 4 weeks with the following provider: Any provider Virtual For C/S patients schedule nurse incision check in weeks 2 weeks: no Low risk pregnancy complicated by: TOLAC Delivery mode:  VBAC Anticipated Birth Control:  Depo inpatient PP Procedures needed: none  Schedule Integrated BH visit: no     Newborn Data: Live born female  Birth Weight: 2750g  APGAR: 13, 9  Newborn Delivery   Birth date/time: 03/21/2020 06:29:00 Delivery type: VBAC, Spontaneous      Baby Feeding: Breast Disposition:home with mother   03/23/2020 Chauncey Mann, MD

## 2020-03-21 NOTE — Progress Notes (Addendum)
OB RR RN called to bedside to assess G2P1 at [redacted]w[redacted]d presenting with contractions. States that she has been contracting all day but contractions increased in strength and frequency around 2000.  States she came in when they were five minutes apart.  Pt has a history of c/s for breech and plans to VBAC.  No other issues or concerns this pregnancy. Denies leaking of fluid and bleeding at this time.  SVE 1/thick/ballotable. 3685 - spoke with Dr. Despina Hidden, he says pt is OB cleared for d/c after 30 min of monitoring Not reactive after 30 min of monitoring (mod variability but only one 15x15). Discussed with EDP, he is more comfortable with pt being evaluated in MAU. Spoke with MAU charge and brought pt to rm 123.

## 2020-03-21 NOTE — H&P (Addendum)
Attestation of Attending Supervision of Advanced Practitioner (CNM/NP/PA): Evaluation and management procedures were performed by the Advanced Practitioner under my supervision and collaboration.  I have reviewed the Advanced Practitioner's note and chart, and I agree with the management and plan.  Angel Grip MD Attending Physician for the Center for [facility] 03/27/2020 10:28 AM    OBSTETRIC [place] is a 24 y.o. female G2P1001 with IUP at 20w2dby LMP c/w 8 wk [place]ing for ctx. Reports ctx started yesterday morning and became much more intense around 2000. She reports +FMs, No LOF, no VB, no blurry vision, headaches or peripheral edema, and RUQ pain.  She plans on breast feeding. She is undecided for birth control. She received her prenatal care at  [facility] LMP c/w 8 wk [place]--->  Estimated Date of Delivery: 03/26/20  Sono:  12/02/2019  @[redacted]w[redacted]d , CWD, normal anatomy, cephalic presentation, anterior placental lie, 624g, 50% EFW   Prenatal History/Complications: Anemia  Depression/Bipolar Hx C-section  Past Medical History: Past Medical History:  Diagnosis Date   Anemia     Past Surgical History: Past Surgical History:  Procedure Laterality Date   CESAREAN SECTION      Obstetrical History: OB History     Gravida  2   Para  1   Term  1   Preterm  0   AB  0   Living  1      SAB  0   TAB  0   Ectopic  0   Multiple  0   Live Births  1           Social History: Social History   Socioeconomic History   Marital status: Single    Spouse name: Not on file   Number of children: Not on file   Years of education: Not on file   Highest education level: Not on file  Occupational History   Not on file  Tobacco Use   Smoking status: Never Smoker   Smokeless tobacco: Never Used  Substance and Sexual Activity   Alcohol use: Not Currently   Drug use: Never   Sexual activity: Not Currently   Other Topics Concern   Not on file  Social History Narrative   Not on file   Social Determinants of Health   Financial Resource Strain:    Difficulty of Paying Living Expenses:   Food Insecurity:    Worried About RCharity fundraiserin the Last Year:    RArboriculturistin the Last Year:   Transportation Needs:    LFilm/video editor(Medical):    Lack of Transportation (Non-Medical):   Physical Activity:    Days of Exercise per Week:    Minutes of Exercise per Session:   Stress:    Feeling of Stress :   Social Connections:    Frequency of Communication with Friends and Family:    Frequency of Social Gatherings with Friends and Family:    Attends Religious Services:    Active Member of Clubs or Organizations:    Attends CArchivistMeetings:    Marital Status:     Family History: History reviewed. No pertinent family history.  Allergies: No Known Allergies  Medications Prior to Admission  Medication Sig Dispense Refill Last Dose   Prenatal Vit-Fe Phos-FA-Omega (VITAFOL GUMMIES) 3.33-0.333-34.8 MG CHEW Chew 1 tablet by mouth daily. 90 tablet 5 Past Month at Unknown  time   Blood Pressure Monitoring (BLOOD PRESSURE KIT) DEVI 1 kit by Does not apply route once a week. Check BP regularly.  Large Cuff.  DX: O90.0 (Patient not taking: Reported on 03/14/2020) 1 each 0    Doxylamine-Pyridoxine (DICLEGIS) 10-10 MG TBEC Take 2 tabs at bedtime. If needed, add another tab in the morning. If needed, add another tab in the afternoon, up to 4 tabs/day. (Patient not taking: Reported on 03/07/2020) 100 tablet 5    Elastic Bandages & Supports (COMFORT FIT MATERNITY SUPP SM) MISC Wear as directed. (Patient not taking: Reported on 03/07/2020) 1 each 0    ondansetron (ZOFRAN ODT) 4 MG disintegrating tablet Take 1 tablet (4 mg total) by mouth every 6 (six) hours as needed for nausea. (Patient not taking: Reported on 03/07/2020) 20 tablet 5      Review of Systems   All systems reviewed  and negative except as stated in HPI  Blood pressure 124/79, pulse 84, temperature (!) 96.3 F (35.7 C), temperature source Tympanic, resp. rate 18, SpO2 100 %. General appearance: alert, cooperative and appears stated age Lungs: normal effort Heart: regular rate  Abdomen: soft, non-tender; bowel sounds normal Pelvic: gravid uterus Extremities: Homans sign is negative, no sign of DVT Presentation: cephalic Fetal monitoringBaseline: 145 bpm, Variability: Good {> 6 bpm), Accelerations: Reactive and Decelerations: occasional lates Uterine activity: Frequency: Every 8 minutes Dilation: 1 Effacement (%): Thick Station: -3 Exam by:: Tomma Rakers RN   Prenatal labs: ABO, Rh: A/Positive/-- (10/26 1648) Antibody: Negative (10/26 1648) Rubella: 20.50 (10/26 1648) RPR: Non Reactive (02/17 1032)  HBsAg: Negative (10/26 1648)  HIV: Non Reactive (02/17 1032)  GBS: Negative/-- (04/26 0258)  2 hr Glucola WNL Genetic screening  Low risk female Anatomy US WNL  Prenatal Transfer Tool  Maternal Diabetes: No Genetic Screening: Normal Maternal Ultrasounds/Referrals: Normal Fetal Ultrasounds or other Referrals:  None Maternal Substance Abuse:  No Significant Maternal Medications:  None Significant Maternal Lab Results: Group B Strep negative  Results for orders placed or performed during the hospital encounter of 03/21/20 (from the past 24 hour(s))  CBC with Differential/Platelet   Collection Time: 03/21/20 12:32 AM  Result Value Ref Range   WBC 13.0 (H) 4.0 - 10.5 K/uL   RBC 3.75 (L) 3.87 - 5.11 MIL/uL   Hemoglobin 9.3 (L) 12.0 - 15.0 g/dL   HCT 31.1 (L) 36.0 - 46.0 %   MCV 82.9 80.0 - 100.0 fL   MCH 24.8 (L) 26.0 - 34.0 pg   MCHC 29.9 (L) 30.0 - 36.0 g/dL   RDW 16.5 (H) 11.5 - 15.5 %   Platelets 304 150 - 400 K/uL   nRBC 0.0 0.0 - 0.2 %   Neutrophils Relative % 70 %   Neutro Abs 9.3 (H) 1.7 - 7.7 K/uL   Lymphocytes Relative 19 %   Lymphs Abs 2.5 0.7 - 4.0 K/uL   Monocytes Relative 8 %    Monocytes Absolute 1.0 0.1 - 1.0 K/uL   Eosinophils Relative 1 %   Eosinophils Absolute 0.1 0.0 - 0.5 K/uL   Basophils Relative 1 %   Basophils Absolute 0.1 0.0 - 0.1 K/uL   Immature Granulocytes 1 %   Abs Immature Granulocytes 0.08 (H) 0.00 - 0.07 K/uL  Comprehensive metabolic panel   Collection Time: 03/21/20 12:32 AM  Result Value Ref Range   Sodium 137 135 - 145 mmol/L   Potassium 4.0 3.5 - 5.1 mmol/L   Chloride 107 98 - 111 mmol/L   CO2  18 (L) 22 - 32 mmol/L   Glucose, Bld 91 70 - 99 mg/dL   BUN 9 6 - 20 mg/dL   Creatinine, Ser 0.59 0.44 - 1.00 mg/dL   Calcium 8.9 8.9 - 10.3 mg/dL   Total Protein 6.8 6.5 - 8.1 g/dL   Albumin 3.1 (L) 3.5 - 5.0 g/dL   AST 25 15 - 41 U/L   ALT 10 0 - 44 U/L   Alkaline Phosphatase 171 (H) 38 - 126 U/L   Total Bilirubin 1.0 0.3 - 1.2 mg/dL   GFR calc non Af Amer >60 >60 mL/min   GFR calc Af Amer >60 >60 mL/min   Anion gap 12 5 - 15  Urinalysis, Routine w reflex microscopic   Collection Time: 03/21/20  1:08 AM  Result Value Ref Range   Color, Urine STRAW (A) YELLOW   APPearance CLEAR CLEAR   Specific Gravity, Urine 1.005 1.005 - 1.030   pH 6.0 5.0 - 8.0   Glucose, UA NEGATIVE NEGATIVE mg/dL   Hgb urine dipstick MODERATE (A) NEGATIVE   Bilirubin Urine NEGATIVE NEGATIVE   Ketones, ur NEGATIVE NEGATIVE mg/dL   Protein, ur NEGATIVE NEGATIVE mg/dL   Nitrite NEGATIVE NEGATIVE   Leukocytes,Ua TRACE (A) NEGATIVE   RBC / HPF 0-5 0 - 5 RBC/hpf   WBC, UA 0-5 0 - 5 WBC/hpf   Bacteria, UA RARE (A) NONE SEEN   Squamous Epithelial / LPF 0-5 0 - 5  Protein / creatinine ratio, urine   Collection Time: 03/21/20  1:08 AM  Result Value Ref Range   Creatinine, Urine 35.54 mg/dL   Total Protein, Urine <6 mg/dL   Protein Creatinine Ratio        0.00 - 0.15 mg/mg[Cre]    Patient Active Problem List   Diagnosis Date Noted   Previous cesarean delivery affecting pregnancy, antepartum 12/30/2019   Depression during 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 G2P1001 at 87w2dhere for ctx and admitted for NRFHT.  #Labor: Vertex by RN exam. Will place FB and start Pit (max 6 while FB in place and then max 20 unless IUPC in place). AROM PRN. Anticipate VBAC. C-section previously for breech presentation. VBAC consent signed 3/15. #Pain: Per patient request #FWB: Cat II; reassuring for moderate variability; EFW: 3400g #ID:  GBS neg #MOF: Breast #MOC: Undecided #Circ:  Inpatient #Anemia: Hgb 9.3 on admission; consider IV iron post-partum.   CBarrington Ellison MD [facility] LPFamily Medicine Fellow, FKaiser [facility] Hsp-[facility]iochfor [facility] 03/21/2020, 2:19 AM

## 2020-03-21 NOTE — Anesthesia Procedure Notes (Signed)
Epidural Patient location during procedure: OB Start time: 03/21/2020 5:47 AM End time: 03/21/2020 5:56 AM  Staffing Anesthesiologist: Achille Rich, MD Performed: anesthesiologist   Preanesthetic Checklist Completed: patient identified, IV checked, site marked, risks and benefits discussed, monitors and equipment checked, pre-op evaluation and timeout performed  Epidural Patient position: sitting Prep: DuraPrep Patient monitoring: heart rate, cardiac monitor, continuous pulse ox and blood pressure Approach: midline Location: L2-L3 Injection technique: LOR saline  Needle:  Needle type: Tuohy  Needle gauge: 17 G Needle length: 9 cm Needle insertion depth: 5 cm Catheter type: closed end flexible Catheter size: 19 Gauge Catheter at skin depth: 11 cm Test dose: negative and Other  Assessment Events: blood not aspirated, injection not painful, no injection resistance and negative IV test  Additional Notes Informed consent obtained prior to proceeding including risk of failure, 1% risk of PDPH, risk of minor discomfort and bruising.  Discussed rare but serious complications including epidural abscess, permanent nerve injury, epidural hematoma.  Discussed alternatives to epidural analgesia and patient desires to proceed.  Timeout performed pre-procedure verifying patient name, procedure, and platelet count.  Patient tolerated procedure well. Reason for block:procedure for pain

## 2020-03-21 NOTE — ED Notes (Signed)
Pt to be admitted to MAU

## 2020-03-21 NOTE — Lactation Note (Signed)
This note was copied from a baby's chart. Lactation Consultation Note  Patient Name: Boy Eleftheria Taborn [id number]'[id number] Date: 03/21/2020 Reason for consult: Initial assessment;Term  1900 - 88 - I conducted an initial consult with Ms. Grabert. Breast feeding basics reviewed. DEBP set up due to difficulty latching. Ms. Belleau has large nipples. I bought her size 36 flanges.  We practiced positioning in cross cradle hold. Baby too sleepy to open wide or latch.  Ms. Bannister was able to obtain 15 mls via pumping. I brought her additional spoons and a feeding cup. Discouraged artificial nipples for now unless medically indicated to allow baby to practice at the breast first.   Recommended breast feeding on demand 8-12 times a day. If baby is having difficulty latching, I recommended pumping and hand expression and feeding EBM back to baby.   All questions answered at this time.   Maternal Data Formula Feeding for Exclusion: No Has patient been taught Hand Expression?: Yes Does the patient have breastfeeding experience prior to this delivery?: Yes  Feeding Feeding Type: Breast Milk  Interventions Interventions: Breast feeding basics reviewed;Assisted with latch;DEBP  Lactation Tools Discussed/Used Tools: Flanges Flange Size: 30 Pump Review: Setup, frequency, and cleaning;Milk Storage Initiated by:: Doristine Church Date initiated:: 03/21/20   Consult Status Consult Status: Follow-up Date: 03/22/20 Follow-up type: In-patient    Walker Shadow 03/21/2020, 7:35 PM

## 2020-03-22 NOTE — Clinical Social Work Maternal (Signed)
CLINICAL SOCIAL WORK MATERNAL/CHILD NOTE  Patient Details  Name: Angel Arnold MRN: [id number] Date of Birth: 11-May-1996  Date:  03/22/2020  Clinical Social Worker Initiating Note:  Hortencia Pilar, LCSW Date/Time: Initiated:  03/22/20/0858     Child's Name:  Angel Arnold.   Biological Parents:  Mother, Father(Emmilyn Peaden, [place] Demetri Delynn Flavin Sr.)   Need for Interpreter:  None   Reason for Referral:  Behavioral Health Concerns   Address:  [address] [place] [address]    Phone number:  [phone] (home)     Additional phone number: none   Household Members/Support Persons (HM/SP):   Household Member/Support Person 1   HM/SP Name Relationship DOB or Age  HM/SP -1 Sharian Arnaud MOB     HM/SP -2   Javione Garrick  FOB    HM/SP -3    son     HM/SP -4        HM/SP -5        HM/SP -6        HM/SP -7        HM/SP -8          Natural Supports (not living in the home):      Professional Supports: None   Employment: Full-time   Type of Work: Chiropractor   Education:  [employer] school graduate   Homebound arranged:   n/a Surveyor, quantity Resources:  Medicaid   Other Resources:  Sales executive , WIC   Cultural/Religious Considerations Which May Impact Care:  none reported to this CSW.   Strengths:  Ability to meet basic needs , Compliance with medical plan , Home prepared for child , Pediatrician chosen   Psychotropic Medications:         Pediatrician:     MOB reports that pediatrician is chosen but unsure of the name.   Pediatrician List:   [facility]      Pediatrician Fax Number:    Risk Factors/Current Problems:  None   Cognitive State:  Able to Concentrate , Insightful , Alert    Mood/Affect:  Relaxed , Comfortable , Calm , Interested    CSW Assessment: CSW consulted as MOB has a hx of depression and Bipolar. CSW went to speak  with MOB at beside to address further needs.   CSW congratulated MOB and FOB on the birth of infant. CSW advised MOB of the HIPPA policy in which MOB was agreeable to FOB remaining in the room while CSW spoke with her. CSW advised MOB of CSW's role and the reason for CSW coming to visit with her. MOB reported that she was diagnosed  with Bipolar and depression "years ago". MOB reported that she has never been in therapy or placed on medications. CSW offered MOB resources in the event that MOB feels that she needs therapy and MOB declined. CSW understanding and was advised by MOB that she isn't feeling SI or HI.   CSW asked MOB about other potential mental health hx and MOB denied having any other mental health diagnosis. MOB reported that her support is FOB and reported that she has WIC and Sales executive. MOB reported that she all needed items for infant including basinet for infant to sleep in. CSW advised MOB and FOB of PPD signs and symptoms  as well as safe sleep. MOB was given education on SIDS  as well as PPD Checklist in order to keep track of her feelings as they relate to PPD. MOB reported no other needs and thanked CSW.   CSW Plan/Description:  No Further Intervention Required/No Barriers to Discharge, Perinatal Mood and Anxiety Disorder (PMADs) Education, Sudden Infant Death Syndrome (SIDS) Education    Wetzel Bjorn[place], [place] 03/22/2020, 9:09 AM

## 2020-03-22 NOTE — Progress Notes (Addendum)
POSTPARTUM PROGRESS NOTE  Subjective: Angel Arnold is a 24 y.o. [id number] s/p VD at [redacted]w[redacted]d.  She reports she doing well. No acute events overnight. She denies any problems with ambulating, voiding or po intake. Denies nausea or vomiting. She has passed flatus. Pain is well controlled.  Lochia is normal.  Objective: Blood pressure 116/65, pulse 70, temperature 98.5 F (36.9 C), temperature source Oral, resp. rate 18, height 5\' 2"  (1.575 m), weight 69.4 kg, SpO2 100 %, unknown if currently breastfeeding.  Physical Exam:  General: alert, cooperative and no distress Chest: no respiratory distress Abdomen: soft, non-tender  Uterine Fundus: firm, appropriately tender Extremities: No calf swelling or tenderness  no edema  Recent Labs    03/21/20 0032  HGB 9.3*  HCT 31.1*    Assessment/Plan: Angel Arnold is a 24 y.o. 25 s/p VD at [redacted]w[redacted]d for early labor and intermittent Cat II tracing.  Routine Postpartum Care: Doing well, pain well-controlled.  -- Continue routine care, lactation support  -- Contraception: Still undecided -- Feeding: Breast  Dispo: Plan for discharge likely 5/12 AM.  7/12, DO, PGY1 [facility]  I saw and evaluated the patient. I agree with the findings and the plan of care as documented in the resident's note. Okay to discharge today if baby can; RN to page team for orders.  [facility], MD [facility] Family Medicine Fellow, [facility] for [facility]

## 2020-03-22 NOTE — Lactation Note (Signed)
This note was copied from a baby's chart. Lactation Consultation Note  Patient Name: Angel Arnold [id number] Date: 03/22/2020 Reason for consult: Follow-up assessment  Infant is 65 hrs old. Mom reports that infant likes cup feeding & she's "trying to stay away from the bottle." Mom says that he is "latching on a lot better," but also says that he is unable to maintain latch. While I was in the room, I assisted with trying to latch infant, but it appears his mouth cannot approximate the size of Mom's nipple at this time. Mom reports that it took her 1st child a few days to be able to latch (that child weighed 7 pounds).   Mom is pumping with the size 36 flanges, which she says are "a lot better" than the size 30 flanges. Mom has a pump at home, but she is unsure of which kind. She knows it is not a Medela pump. I provided her with a hand pump so that she will have something that her size 36 flanges are compatible with. Mom says she is pumping q2h. She is obtaining about 20-22 mL when pumping.   We finished feeding the infant the amount of EBM available before infant left for his circumcision. Mom will pump in his absence. Mom has no breast complaints and says that her breasts are feeling fuller today.   Parents were also taught how to calm infant with EBM on a clean finger so that infant is not being cup-fed while crying.   Lurline Hare [facility] 03/22/2020, 9:14 AM

## 2020-03-23 ENCOUNTER — Telehealth: Payer: Medicaid Other | Admitting: Obstetrics

## 2020-03-23 MED ORDER — ACETAMINOPHEN 325 MG PO TABS: 650.0000 mg | ORAL_TABLET | Freq: Four times a day (QID) | ORAL | 0 refills | Status: AC | PRN

## 2020-03-23 MED ORDER — OXYCODONE HCL 5 MG PO TABS
5.0000 mg | ORAL_TABLET | ORAL | Status: DC | PRN
  Administered 2020-03-23: 01:00:00 5 mg via ORAL
  Filled 2020-03-23: qty 1

## 2020-03-23 MED ORDER — MEDROXYPROGESTERONE ACETATE 150 MG/ML IM SUSP: 150.0000 mg | Freq: Once | INTRAMUSCULAR | Status: DC

## 2020-03-23 MED ORDER — SENNOSIDES-DOCUSATE SODIUM 8.6-50 MG PO TABS: 2.0000 | ORAL_TABLET | ORAL | 0 refills | Status: AC

## 2020-03-23 MED ORDER — IBUPROFEN 600 MG PO TABS: 600.0000 mg | ORAL_TABLET | Freq: Three times a day (TID) | ORAL | 0 refills | Status: AC | PRN

## 2020-03-23 NOTE — Lactation Note (Signed)
This note was copied from a baby's chart. Lactation Consultation Note  Patient Name: Angel Arnold [id number] Date: 03/23/2020 Reason for consult: Follow-up assessment;Infant weight loss;Other (Comment);Term(3 % weight loss / per mom plans to pump abd bottle feed - LC faxed a request to [place] for a DEBP today)  Baby is 51 hours old and for D/C today  As LC entered the room , mom pumping one breast with a hand pump.  Mom informed the [place] she preferred the hand pump over the DEBP and her  Plan was still to juts pump and bottle feed.  LC reviewed supply and demand / importance breast stimulation 8-10 times a day  With hand expressing , hand pump ( moms preference ) and to have a string plan B of a DEBP from [facility] to make it easier on mom to protect and establishing milk supply.  Mom mentioned she had spoke to [place] yesterday and still had to sign baby up.  LC recommended to call [facility] back today and LC would fax a referral for a DEBP and inform [place] her milk was in and she was pumping off a good amount.  Sore nipple and engorgement prevention and tx reviewed.  Mom has the DEBP kit and and pump for D/C.  S/S of mastitis reviewed with mom and dad .  LC updated the baby's recent feedings, 8 am -  20 ml and 9 am - 15 ml. LC recommended prior   To feeding check the baby's diaper if needed, unwrap and even undress if needed so the baby is more awake for the feeding . Work on getting baby to take at least 30 ml due to the age of the  31 and increase gradually.  Both mom and dad receptive to teaching and asked appropriate lactation  questions.     Maternal Data Has patient been taught Hand Expression?: Yes  Feeding Feeding Type: (per dad baby recently fed at 8 am for 20 ml and 9 am 15 ml - sleeping at present)  LATCH Score                   Interventions Interventions: Breast feeding basics reviewed;Hand pump;DEBP  Lactation Tools Discussed/Used Tools: Pump;Flanges Flange Size:  24;27 Breast pump type: Double-Electric Breast Pump;Manual WIC Program: Yes(per mom spoke to [facility] yesterday and has to get the baby signed up - [place] recommended to call [place] back today for a DEBP - LC sent a referral) Pump Review: Milk Storage   Consult Status Consult Status: Complete Date: 03/23/20    [place] 03/23/2020, 11:05 AM

## 2020-03-23 NOTE — Discharge Summary (Deleted)
Postpartum Discharge Summary     Patient Name: Angel Arnold DOB: [birth year] MRN: [id number]  Date of admission: 03/21/2020 Delivering Provider: Chauncey Mann   Date of discharge: 03/23/2020  Admitting diagnosis: Labor and delivery, indication for care [O75.9] Intrauterine pregnancy: [redacted]w[redacted]d    Secondary diagnosis:  Active Problems:   Depression during pregnancy in first trimester   History of bipolar disorder   Previous cesarean delivery affecting pregnancy, antepartum   Labor and delivery, indication for care  Additional problems: None     Discharge diagnosis: Term Pregnancy Delivered and VBAC                                                                                                Post partum procedures:None  Augmentation: Pitocin  Complications: None  Hospital course:  Onset of Labor With Vaginal Delivery     24y.o. yo G2P1001 at 323w2das admitted in Latent Labor on 03/21/2020. Patient had an uncomplicated labor course as follows: Initial SVE: 1/thick/-3 and patient admitted for intermittent Cat II tracing. Patient received only 2 milliunits of Pitocin. SROM occurred and she then progressed to complete.  Membrane Rupture Time/Date: 5:56 AM ,03/21/2020   Intrapartum Procedures: Episiotomy: None [1]                                         Lacerations:  Labial [10]  Patient had a delivery of a Viable infant. 03/21/2020  Information for the patient's newborn:  [place], [place][[id number]]Delivery Method: Vag-Spont     Pateint had an uncomplicated postpartum course.  She is ambulating, tolerating a regular diet, passing flatus, and urinating well. Patient is discharged home in stable condition on 03/23/20.  Delivery time: 6:29 AM    Magnesium Sulfate received: No BMZ received: No Rhophylac:No MMR:No Transfusion:No  Physical exam  Vitals:   03/22/20 1350 03/22/20 2141 03/23/20 0549 03/23/20 0557  BP: 112/67 109/75 115/78   Pulse: 84 87 68   Resp: 18  18    Temp: 98.3 F (36.8 C) 98.8 F (37.1 C)  98.1 F (36.7 C)  TempSrc: Oral Oral  Oral  SpO2:  100% 100%   Weight:      Height:       General: alert, cooperative and no distress Lochia: appropriate Uterine Fundus: firm Incision: N/A DVT Evaluation: No evidence of DVT seen on physical exam. Labs: Lab Results  Component Value Date   WBC 13.0 (H) 03/21/2020   HGB 9.3 (L) 03/21/2020   HCT 31.1 (L) 03/21/2020   MCV 82.9 03/21/2020   PLT 304 03/21/2020   CMP Latest Ref Rng & Units 03/21/2020  Glucose 70 - 99 mg/dL 91  BUN 6 - 20 mg/dL 9  Creatinine 0.44 - 1.00 mg/dL 0.59  Sodium 135 - 145 mmol/L 137  Potassium 3.5 - 5.1 mmol/L 4.0  Chloride 98 - 111 mmol/L 107  CO2 22 - 32 mmol/L 18(L)  Calcium 8.9 - 10.3 mg/dL 8.9  Total Protein 6.5 -  8.1 g/dL 6.8  Total Bilirubin 0.3 - 1.2 mg/dL 1.0  Alkaline Phos 38 - 126 U/L 171(H)  AST 15 - 41 U/L 25  ALT 0 - 44 U/L 10   Edinburgh Score: Edinburgh Postnatal Depression Scale Screening Tool 03/22/2020  I have been able to laugh and see the funny side of things. 0  I have looked forward with enjoyment to things. 0  I have blamed myself unnecessarily when things went wrong. 1  I have been anxious or worried for no good reason. 1  I have felt scared or panicky for no good reason. 0  Things have been getting on top of me. 0  I have been so unhappy that I have had difficulty sleeping. 0  I have felt sad or miserable. 0  I have been so unhappy that I have been crying. 0  The thought of harming myself has occurred to me. 0  Edinburgh Postnatal Depression Scale Total 2    Discharge instruction: per After Visit Summary and "Baby and Me Booklet".  After visit meds:  Allergies as of 03/23/2020   No Known Allergies     Medication List    STOP taking these medications   Comfort Fit Maternity Supp Sm Misc   Doxylamine-Pyridoxine 10-10 MG Tbec Commonly known as: Diclegis   ondansetron 4 MG disintegrating tablet Commonly known as: Zofran  ODT     TAKE these medications   acetaminophen 325 MG tablet Commonly known as: Tylenol Take 2 tablets (650 mg total) by mouth every 6 (six) hours as needed (for pain scale < 4).   Blood Pressure Kit Devi 1 kit by Does not apply route once a week. Check BP regularly.  Large Cuff.  DX: O90.0   ibuprofen 600 MG tablet Commonly known as: ADVIL Take 1 tablet (600 mg total) by mouth every 8 (eight) hours as needed for mild pain.   senna-docusate 8.6-50 MG tablet Commonly known as: Senokot-S Take 2 tablets by mouth daily. Start taking on: Mar 24, 2020   Vitafol Gummies 3.33-0.333-34.8 MG Chew Chew 1 tablet by mouth daily.       Diet: routine diet  Activity: Advance as tolerated. Pelvic rest for 6 weeks.   Outpatient follow up:4 weeks Follow up Appt: Future Appointments  Date Time Provider [place]  05/02/2020  2:00 PM Constant, Vickii Chafe, MD [place] None   Follow up Visit:    Please schedule this patient for Postpartum visit in: 4 weeks with the following provider: Any provider Virtual For C/S patients schedule nurse incision check in weeks 2 weeks: no Low risk pregnancy complicated by: TOLAC Delivery mode:  VBAC Anticipated Birth Control:  Depo inpatient PP Procedures needed: none  Schedule Integrated BH visit: no     Newborn Data: Live born female  Birth Weight: 2750g  APGAR: 56, 9  Newborn Delivery   Birth date/time: 03/21/2020 06:29:00 Delivery type: VBAC, Spontaneous      Baby Feeding: Breast Disposition:home with mother   03/23/2020 Chauncey Mann, MD

## 2020-04-07 IMAGING — US US MFM OB COMP +14 WKS
1 series · 13 of 28 positions shown · non-contrast
Comparison: none

[Series 1: us mfm ob comp +14 wks · 13 of 119 slices shown]
[im 5/119]
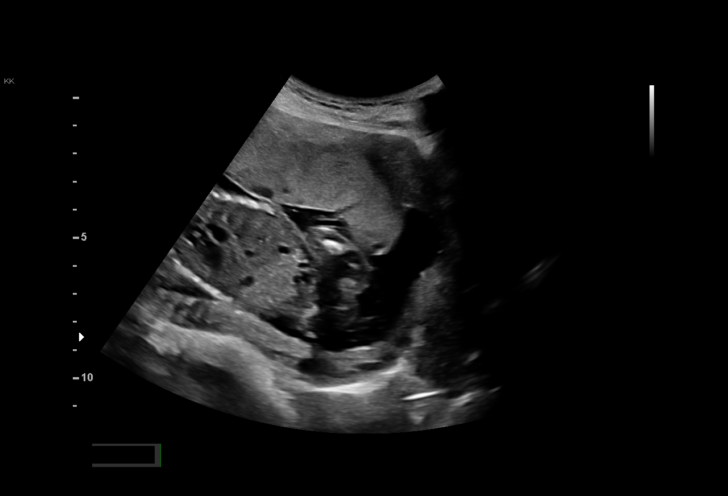
[im 14/119]
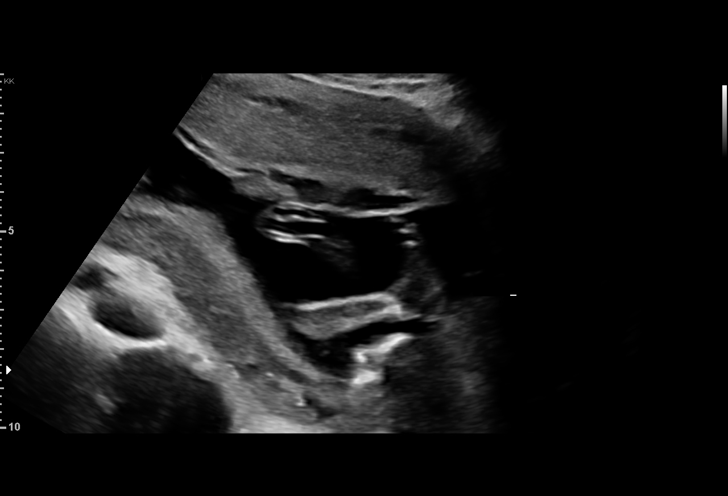
[im 22/119]
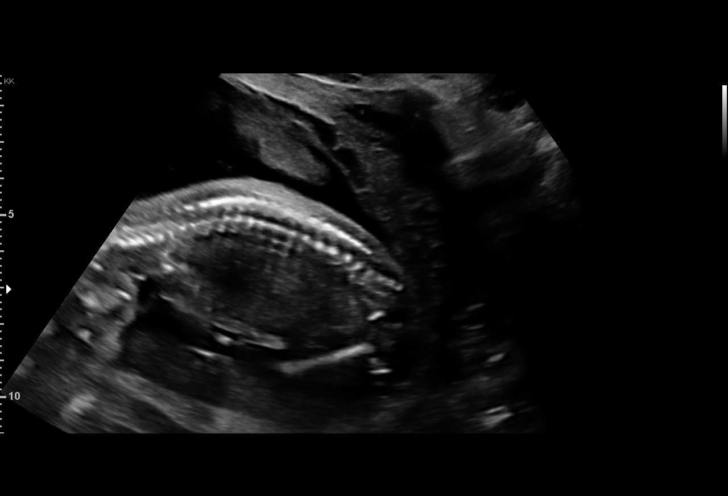
[im 31/119]
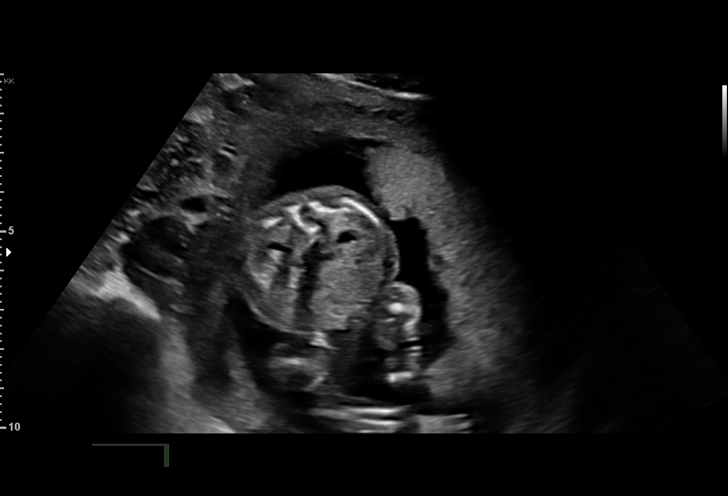
[im 40/119]
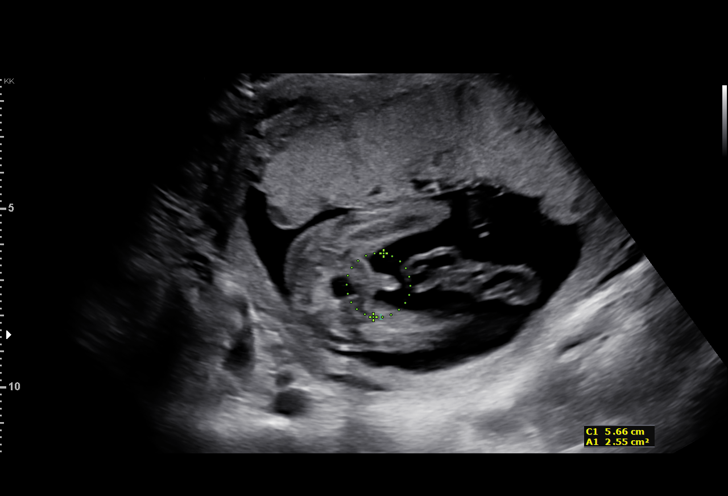
[im 49/119]
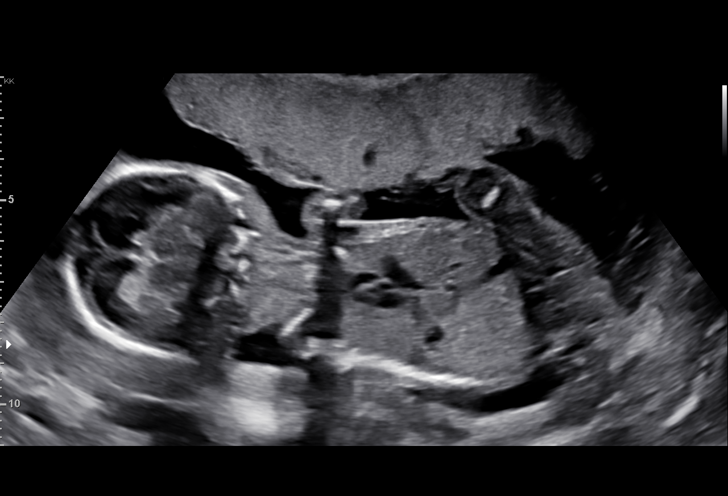
[im 62/119]
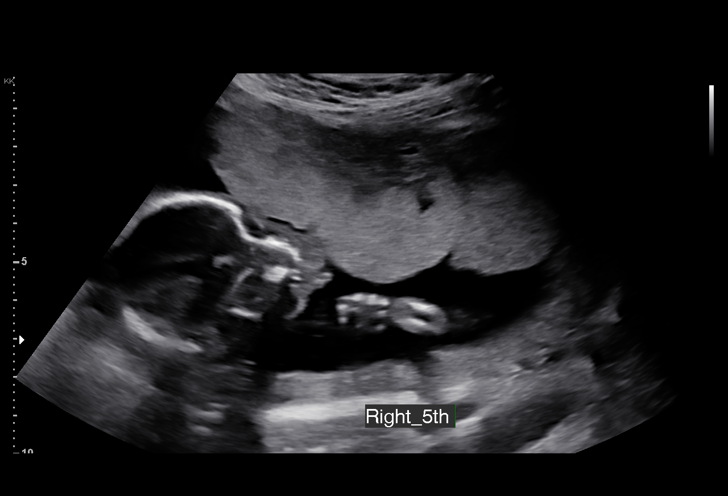
[im 70/119]
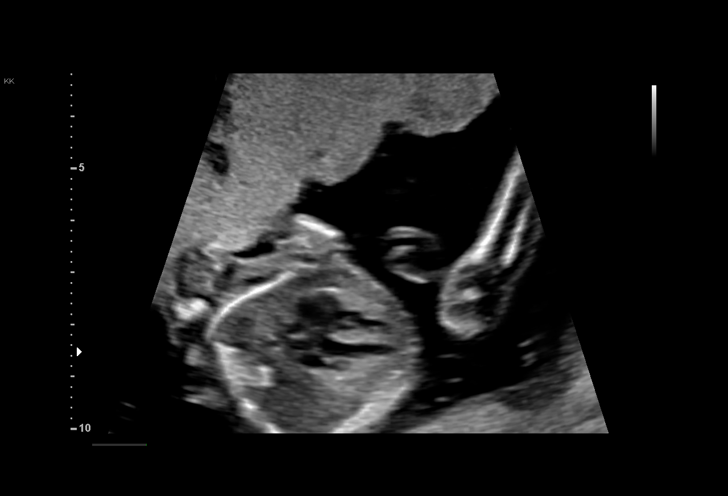
[im 79/119]
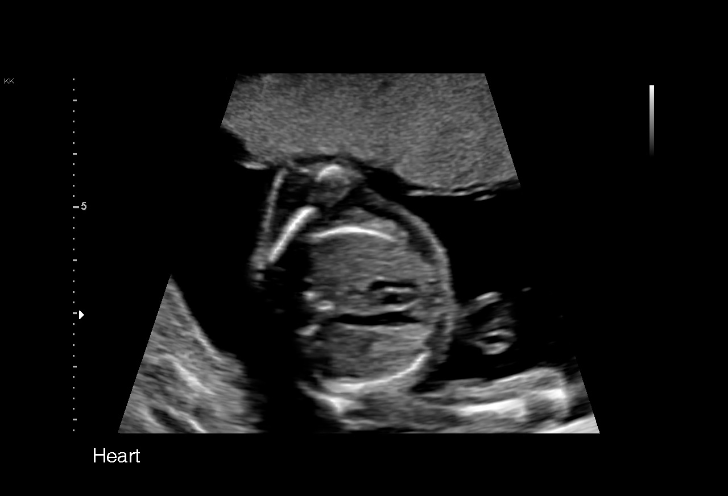
[im 88/119]
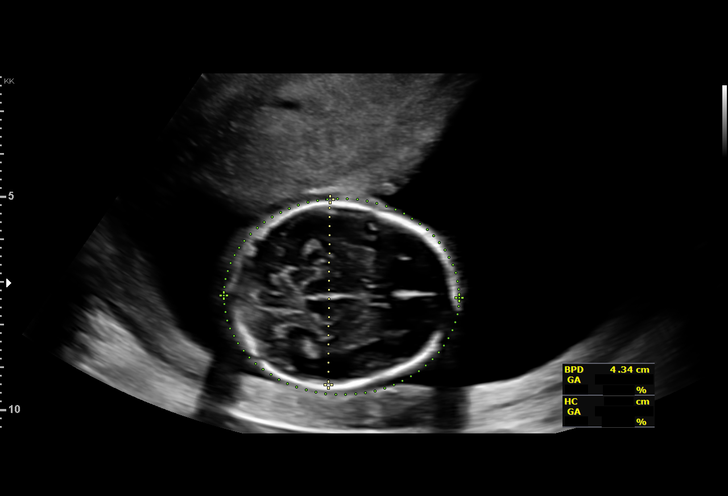
[im 97/119]
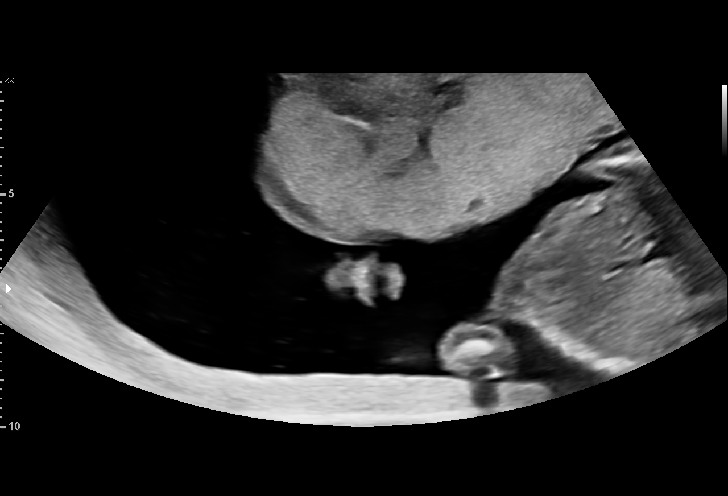
[im 105/119]
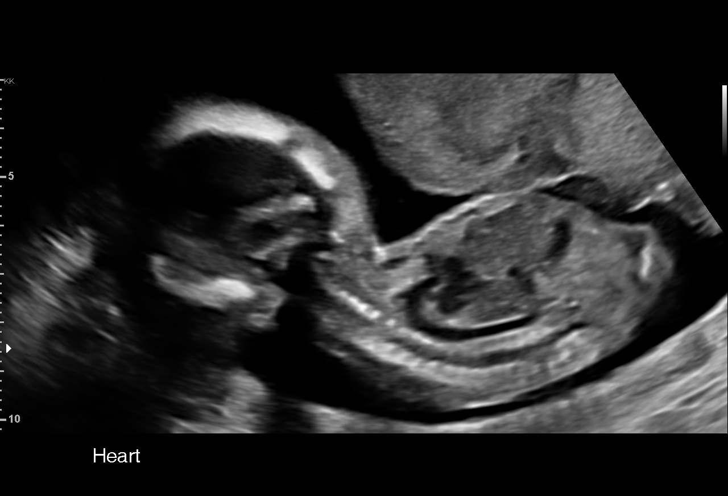
[im 114/119]
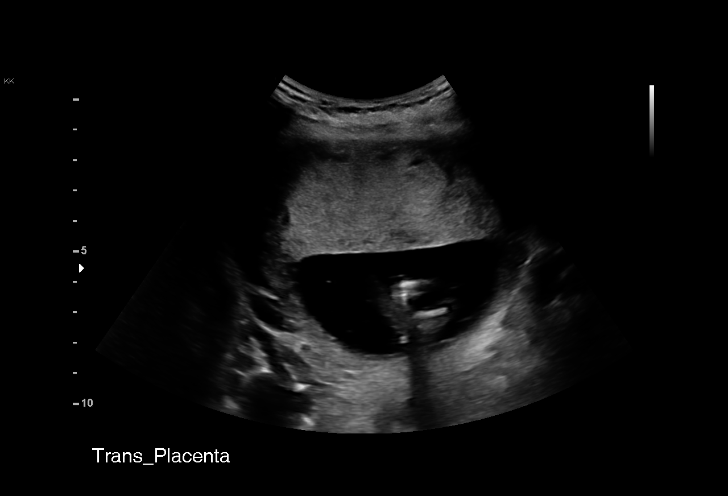

[13 of 28 positions shown; findings below may reference images not displayed]

Ref. Address:     Faculty

  1  US MFM OB COMP + 14 WK               [address]805.01     SEJID TABBESSI
 ----------------------------------------------------------------------

 ----------------------------------------------------------------------
Indications

  Encounter for antenatal screening for
  malformations
  Low Risk NIPS
  18 weeks gestation of pregnancy
 ----------------------------------------------------------------------
Fetal Evaluation

 Num Of Fetuses:         1
 Fetal Heart Rate(bpm):  154
 Cardiac Activity:       Observed
 Presentation:           Breech
 Placenta:               Anterior
 P. Cord Insertion:      Visualized

 Amniotic Fluid
 AFI FV:      Within normal limits

                             Largest Pocket(cm)

Biometry

 BPD:      43.6  mm     G. Age:  19w 1d         76  %    CI:        75.76   %    70 - 86
                                                         FL/HC:      18.6   %    16.1 -
 HC:      158.8  mm     G. Age:  18w 5d         51  %    HC/AC:      1.17        1.09 -
 AC:      135.9  mm     G. Age:  19w 0d         63  %    FL/BPD:     67.9   %
 FL:       29.6  mm     G. Age:  19w 1d         65  %    FL/AC:      21.8   %    20 - 24
 NFT:         4  mm
 Est. FW:     271  gm    0 lb 10 oz      74  %
OB History

 Gravidity:    2         Term:   1        Prem:   0        SAB:   0
 TOP:          0       Ectopic:  0        Living: 1
Gestational Age

 U/S Today:     19w 0d                                        EDD:   03/23/20
 Best:          18w 4d     Det. By:  Early Ultrasound         EDD:   03/26/20
                                     (08/16/19)
Anatomy

 Cranium:               Appears normal         LVOT:                   Not well visualized
 Cavum:                 Appears normal         Aortic Arch:            Appears normal
 Ventricles:            Appears normal         Ductal Arch:            Appears normal
 Choroid Plexus:        Appears normal         Diaphragm:              Appears normal
 Cerebellum:            Appears normal         Stomach:                Appears normal, left
                                                                       sided
 Posterior Fossa:       Appears normal         Abdomen:                Appears normal
 Nuchal Fold:           Appears normal         Abdominal Wall:         Appears nml (cord
                                                                       insert, abd wall)
 Face:                  Appears normal         Cord Vessels:           Appears normal (3
                        (orbits and profile)                           vessel cord)
 Lips:                  Appears normal         Kidneys:                Appear normal
 Palate:                Not well visualized    Bladder:                Appears normal
 Thoracic:              Appears normal         Spine:                  Appears normal
 Heart:                 Not well visualized    Upper Extremities:      Appears normal
 RVOT:                  Not well visualized    Lower Extremities:      Appears normal

 Other:  Male gender. Heels and 5th digit visualized. Technically difficult due
         to fetal position.
Cervix Uterus Adnexa

 Cervix
 Length:            3.2  cm.
 Normal appearance by transabdominal scan.
Impression

 We performed fetal anatomy scan. No makers of
 aneuploidies or fetal structural defects are seen. Fetal
 biometry is consistent with her previously-established dates.
 Amniotic fluid is normal and good fetal activity is seen.
 Patient understands the limitations of ultrasound in detecting
 fetal anomalies.

 On cell-free fetal DNA screening, the risks of fetal
 aneuploidies are not increased.
Recommendations

 -An appointment was made for her to return in 5 weeks for
 completion of fetal anatomy.
                 Delafuente, Tej

## 2020-05-02 ENCOUNTER — Telehealth: Payer: Medicaid Other | Admitting: Obstetrics and Gynecology

## 2020-05-02 ENCOUNTER — Encounter: Payer: Self-pay | Admitting: Obstetrics

## 2020-05-02 ENCOUNTER — Telehealth (INDEPENDENT_AMBULATORY_CARE_PROVIDER_SITE_OTHER): Payer: Medicaid Other | Admitting: Obstetrics

## 2020-05-02 DIAGNOSIS — Z3009 Encounter for other general counseling and advice on contraception: Secondary | ICD-10-CM

## 2020-05-02 DIAGNOSIS — Z30013 Encounter for initial prescription of injectable contraceptive: Secondary | ICD-10-CM

## 2020-05-02 MED ORDER — MEDROXYPROGESTERONE ACETATE 150 MG/ML IM SUSP: 150.0000 mg | INTRAMUSCULAR | 4 refills | Status: AC
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
## 2020-05-30 ENCOUNTER — Ambulatory Visit: Payer: Medicaid Other | Admitting: Obstetrics

## 2023-05-06 ENCOUNTER — Encounter (HOSPITAL_BASED_OUTPATIENT_CLINIC_OR_DEPARTMENT_OTHER): Payer: Self-pay

## 2023-05-06 ENCOUNTER — Emergency Department (HOSPITAL_BASED_OUTPATIENT_CLINIC_OR_DEPARTMENT_OTHER)
Admission: EM | Admit: 2023-05-06 | Discharge: 2023-05-06 | Disposition: A | Discharge status: Home / Self Care | Payer: Self-pay | Attending: Emergency Medicine | Admitting: Emergency Medicine

## 2023-05-06 ENCOUNTER — Other Ambulatory Visit: Payer: Self-pay

## 2023-05-06 ENCOUNTER — Emergency Department (HOSPITAL_BASED_OUTPATIENT_CLINIC_OR_DEPARTMENT_OTHER): Payer: Self-pay

## 2023-05-06 DIAGNOSIS — S060X1A Concussion with loss of consciousness of 30 minutes or less, initial encounter: Secondary | ICD-10-CM | POA: Insufficient documentation

## 2023-05-06 MED ORDER — ACETAMINOPHEN 325 MG PO TABS
650.0000 mg | ORAL_TABLET | Freq: Once | ORAL | Status: AC
  Administered 2023-05-06: 650 mg via ORAL
  Filled 2023-05-06: qty 2

## 2023-05-06 MED ORDER — IBUPROFEN 400 MG PO TABS
400.0000 mg | ORAL_TABLET | Freq: Once | ORAL | Status: AC
  Administered 2023-05-06: 400 mg via ORAL
  Filled 2023-05-06: qty 1
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
